# Patient Record
Sex: Male | Born: 1950 | Race: White | Hispanic: No | Marital: Single | Smoking: Former smoker
Health system: Southern US, Community
[De-identification: ages and names within clinical notes are randomized; demographics above are authoritative.]

## PROBLEM LIST (undated history)

## (undated) DIAGNOSIS — J439 Emphysema, unspecified: Secondary | ICD-10-CM

## (undated) DIAGNOSIS — I499 Cardiac arrhythmia, unspecified: Secondary | ICD-10-CM

## (undated) DIAGNOSIS — J45909 Unspecified asthma, uncomplicated: Secondary | ICD-10-CM

## (undated) HISTORY — PX: OTHER SURGICAL HISTORY: SHX169

## (undated) HISTORY — PX: TONSILLECTOMY: SUR1361

## (undated) HISTORY — DX: Unspecified asthma, uncomplicated: J45.909

## (undated) HISTORY — DX: Cardiac arrhythmia, unspecified: I49.9

## (undated) HISTORY — PX: HERNIA REPAIR: SHX51

## (undated) HISTORY — DX: Emphysema, unspecified: J43.9

## (undated) HISTORY — PX: EYE SURGERY: SHX253

## (undated) HISTORY — PX: ADENOIDECTOMY: SUR15

## (undated) HISTORY — PX: SINOSCOPY: SHX187

---

## 1998-11-09 ENCOUNTER — Ambulatory Visit: Admission: RE | Admit: 1998-11-09 | Discharge: 1998-11-09 | Payer: Self-pay

## 2005-06-28 ENCOUNTER — Ambulatory Visit: Payer: Self-pay | Admitting: Internal Medicine

## 2011-12-27 DIAGNOSIS — M75110 Incomplete rotator cuff tear or rupture of unspecified shoulder, not specified as traumatic: Secondary | ICD-10-CM | POA: Insufficient documentation

## 2011-12-27 HISTORY — DX: Incomplete rotator cuff tear or rupture of unspecified shoulder, not specified as traumatic: M75.110

## 2014-09-15 DIAGNOSIS — J310 Chronic rhinitis: Secondary | ICD-10-CM | POA: Diagnosis not present

## 2014-09-15 DIAGNOSIS — R0600 Dyspnea, unspecified: Secondary | ICD-10-CM | POA: Diagnosis not present

## 2014-09-15 DIAGNOSIS — R5383 Other fatigue: Secondary | ICD-10-CM | POA: Diagnosis not present

## 2014-09-15 DIAGNOSIS — G4730 Sleep apnea, unspecified: Secondary | ICD-10-CM | POA: Diagnosis not present

## 2014-09-19 DIAGNOSIS — G4733 Obstructive sleep apnea (adult) (pediatric): Secondary | ICD-10-CM | POA: Diagnosis not present

## 2014-09-22 DIAGNOSIS — R002 Palpitations: Secondary | ICD-10-CM | POA: Diagnosis not present

## 2014-09-22 DIAGNOSIS — J310 Chronic rhinitis: Secondary | ICD-10-CM | POA: Diagnosis not present

## 2014-09-22 DIAGNOSIS — R0602 Shortness of breath: Secondary | ICD-10-CM | POA: Diagnosis not present

## 2014-09-29 DIAGNOSIS — J310 Chronic rhinitis: Secondary | ICD-10-CM | POA: Diagnosis not present

## 2014-10-02 DIAGNOSIS — J449 Chronic obstructive pulmonary disease, unspecified: Secondary | ICD-10-CM | POA: Diagnosis not present

## 2014-10-02 DIAGNOSIS — I472 Ventricular tachycardia: Secondary | ICD-10-CM | POA: Diagnosis not present

## 2014-10-06 DIAGNOSIS — J310 Chronic rhinitis: Secondary | ICD-10-CM | POA: Diagnosis not present

## 2014-10-13 DIAGNOSIS — Z6833 Body mass index (BMI) 33.0-33.9, adult: Secondary | ICD-10-CM | POA: Diagnosis not present

## 2014-10-13 DIAGNOSIS — J310 Chronic rhinitis: Secondary | ICD-10-CM | POA: Diagnosis not present

## 2014-10-13 DIAGNOSIS — M7750 Other enthesopathy of unspecified foot: Secondary | ICD-10-CM | POA: Diagnosis not present

## 2014-10-15 DIAGNOSIS — E785 Hyperlipidemia, unspecified: Secondary | ICD-10-CM | POA: Diagnosis not present

## 2014-10-15 DIAGNOSIS — I472 Ventricular tachycardia: Secondary | ICD-10-CM | POA: Diagnosis not present

## 2014-10-15 DIAGNOSIS — J449 Chronic obstructive pulmonary disease, unspecified: Secondary | ICD-10-CM | POA: Diagnosis not present

## 2014-10-15 DIAGNOSIS — F331 Major depressive disorder, recurrent, moderate: Secondary | ICD-10-CM | POA: Diagnosis not present

## 2014-10-20 DIAGNOSIS — J310 Chronic rhinitis: Secondary | ICD-10-CM | POA: Diagnosis not present

## 2014-10-20 DIAGNOSIS — R002 Palpitations: Secondary | ICD-10-CM | POA: Diagnosis not present

## 2014-10-27 DIAGNOSIS — J310 Chronic rhinitis: Secondary | ICD-10-CM | POA: Diagnosis not present

## 2014-10-30 DIAGNOSIS — I472 Ventricular tachycardia: Secondary | ICD-10-CM | POA: Diagnosis not present

## 2014-11-03 DIAGNOSIS — J310 Chronic rhinitis: Secondary | ICD-10-CM | POA: Diagnosis not present

## 2014-11-03 DIAGNOSIS — M79671 Pain in right foot: Secondary | ICD-10-CM | POA: Diagnosis not present

## 2014-11-03 DIAGNOSIS — Z6833 Body mass index (BMI) 33.0-33.9, adult: Secondary | ICD-10-CM | POA: Diagnosis not present

## 2014-11-10 DIAGNOSIS — J310 Chronic rhinitis: Secondary | ICD-10-CM | POA: Diagnosis not present

## 2014-11-17 DIAGNOSIS — J310 Chronic rhinitis: Secondary | ICD-10-CM | POA: Diagnosis not present

## 2014-11-24 DIAGNOSIS — Z6833 Body mass index (BMI) 33.0-33.9, adult: Secondary | ICD-10-CM | POA: Diagnosis not present

## 2014-11-24 DIAGNOSIS — J310 Chronic rhinitis: Secondary | ICD-10-CM | POA: Diagnosis not present

## 2014-11-24 DIAGNOSIS — M629 Disorder of muscle, unspecified: Secondary | ICD-10-CM | POA: Diagnosis not present

## 2014-11-27 DIAGNOSIS — I493 Ventricular premature depolarization: Secondary | ICD-10-CM | POA: Diagnosis not present

## 2014-12-04 ENCOUNTER — Ambulatory Visit (INDEPENDENT_AMBULATORY_CARE_PROVIDER_SITE_OTHER): Payer: Commercial Managed Care - HMO

## 2014-12-04 VITALS — BP 131/74 | HR 55 | Resp 18

## 2014-12-04 DIAGNOSIS — R52 Pain, unspecified: Secondary | ICD-10-CM

## 2014-12-04 DIAGNOSIS — M722 Plantar fascial fibromatosis: Secondary | ICD-10-CM

## 2014-12-04 DIAGNOSIS — M7731 Calcaneal spur, right foot: Secondary | ICD-10-CM

## 2014-12-04 MED ORDER — MELOXICAM 15 MG PO TABS: 15.0000 mg | ORAL_TABLET | Freq: Every day | ORAL | Status: DC

## 2014-12-04 NOTE — Patient Instructions (Signed)
ICE INSTRUCTIONS  Apply ice or cold pack to the affected area at least 3 times a day for 10-15 minutes each time.  You should also use ice after prolonged activity or vigorous exercise.  Do not apply ice longer than 20 minutes at one time.  Always keep a cloth between your skin and the ice pack to prevent burns.  Being consistent and following these instructions will help control your symptoms.  We suggest you purchase a gel ice pack because they are reusable and do bit leak.  Some of them are designed to wrap around the area.  Use the method that works best for you.  Here are some other suggestions for icing.   Use a frozen bag of peas or corn-inexpensive and molds well to your body, usually stays frozen for 10 to 20 minutes.  Wet a towel with cold water and squeeze out the excess until it's damp.  Place in a bag in the freezer for 20 minutes. Then remove and use.  Apply ice to the heel every evening 15 minutes on 15 minutes off repeat 2 or 3 times.  Maintain the taping on foot for 5 days as instructed

## 2014-12-04 NOTE — Progress Notes (Signed)
   Subjective:    Patient ID: Nicholas Clay, male    DOB: 22-Oct-1950, 64 y.o.   MRN: [id number]  HPI I HAVE SOME ARCH PAIN ON MY RIGHT FOOT AND HAS BEEN GOING ON FOR ABOUT 2 MONTHS AND HAS A SHARP PAIN TO IT AND I HAVE SOME GOOD FEET INSERTS AND THE ARCH ACHES SOME    Review of Systems  HENT: Positive for hearing loss, sinus pressure and sore throat.   Respiratory:       DIFFICULTY BREATHING  All other systems reviewed and are negative.      Objective:   Physical Exam neurovascular status is intact pedal pulses are palpable epicritic and proprioceptive sensations intact and symmetric there is normal plantar response and DTRs. Patient does have a history of heel pain has been wearing good foot orthotics for more than 10 or 15 years. However his right orthotic has extra padding sponge and cushioning in the arch with duct tape holding in place. Patient is having medial arch pain from mid band although to the inferior calcaneal tubercle on the right left foot is asymptomatic. X-rays confirm well developed inferior calcaneal spur fascial thickening promontory changes noted on lateral projection. 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      Assessment & Plan:  Assessment plantar fasciitis/heel spur syndrome right foot plan at this time patient given a prescription for meloxicam 15 g once daily discontinue if any GI upset is noted days does have a old history of possible small bleed however has not had issues since then. At this time fascial strapping is applied to the right  foot reappointed 2 weeks for follow-up and reevaluation and be a strong candidate for functional orthoses a custom orthotic in the future based on progress and understands his Medicare likely not cover orthotic it will be an out-of-pocket costs that he can make payments on. At this time use NSAID ice and fascial strapping recheck in 2 weeks for reevaluation  Harriet Masson DPM

## 2014-12-08 DIAGNOSIS — J310 Chronic rhinitis: Secondary | ICD-10-CM | POA: Diagnosis not present

## 2014-12-09 DIAGNOSIS — I493 Ventricular premature depolarization: Secondary | ICD-10-CM | POA: Diagnosis not present

## 2014-12-11 DIAGNOSIS — I493 Ventricular premature depolarization: Secondary | ICD-10-CM | POA: Diagnosis not present

## 2014-12-11 DIAGNOSIS — J449 Chronic obstructive pulmonary disease, unspecified: Secondary | ICD-10-CM | POA: Diagnosis not present

## 2014-12-22 ENCOUNTER — Ambulatory Visit: Payer: Commercial Managed Care - HMO

## 2014-12-22 DIAGNOSIS — J310 Chronic rhinitis: Secondary | ICD-10-CM | POA: Diagnosis not present

## 2014-12-25 ENCOUNTER — Ambulatory Visit (INDEPENDENT_AMBULATORY_CARE_PROVIDER_SITE_OTHER): Payer: Commercial Managed Care - HMO | Admitting: Podiatry

## 2014-12-25 ENCOUNTER — Encounter: Payer: Self-pay | Admitting: Podiatry

## 2014-12-25 VITALS — BP 118/70 | HR 55 | Resp 14

## 2014-12-25 DIAGNOSIS — M722 Plantar fascial fibromatosis: Secondary | ICD-10-CM | POA: Diagnosis not present

## 2014-12-25 DIAGNOSIS — R52 Pain, unspecified: Secondary | ICD-10-CM

## 2014-12-25 MED ORDER — TRIAMCINOLONE ACETONIDE 10 MG/ML IJ SUSP
10.0000 mg | Freq: Once | INTRAMUSCULAR | Status: AC
  Administered 2014-12-25: 10 mg

## 2014-12-25 NOTE — Patient Instructions (Signed)

## 2014-12-26 NOTE — Progress Notes (Signed)
Subjective:     Patient ID: Nicholas Clay, male   DOB: [birth year], 64 y.o.   MRN: [id number]  HPI patient presents stating that he is getting some pain in his right arch again and it's more distal the taping helped for a short period of time   Review of Systems     Objective:   Physical Exam Neurovascular status intact muscle strength adequate with range of motion subtalar midtarsal joint within normal limits. Distal inflammation of the plantar fascia around its insertion into the head of the first metatarsal and slightly proximal with mild fluid buildup and moderate depression of the arch noted    Assessment:     Distal plantar fasciitis right with arch height abnormal    Plan:     Careful injection distal plantar fascia administered along with fascial brace and instructions on long-term orthotics with scanning administered today. Patient be seen back when orthotics returned

## 2014-12-27 DIAGNOSIS — S0510XA Contusion of eyeball and orbital tissues, unspecified eye, initial encounter: Secondary | ICD-10-CM | POA: Diagnosis not present

## 2014-12-27 DIAGNOSIS — Z6832 Body mass index (BMI) 32.0-32.9, adult: Secondary | ICD-10-CM | POA: Diagnosis not present

## 2014-12-27 DIAGNOSIS — S60229A Contusion of unspecified hand, initial encounter: Secondary | ICD-10-CM | POA: Diagnosis not present

## 2015-01-05 DIAGNOSIS — J310 Chronic rhinitis: Secondary | ICD-10-CM | POA: Diagnosis not present

## 2015-01-06 DIAGNOSIS — Z6832 Body mass index (BMI) 32.0-32.9, adult: Secondary | ICD-10-CM | POA: Diagnosis not present

## 2015-01-06 DIAGNOSIS — M25512 Pain in left shoulder: Secondary | ICD-10-CM | POA: Diagnosis not present

## 2015-01-14 DIAGNOSIS — F331 Major depressive disorder, recurrent, moderate: Secondary | ICD-10-CM | POA: Diagnosis not present

## 2015-01-15 DIAGNOSIS — Z6832 Body mass index (BMI) 32.0-32.9, adult: Secondary | ICD-10-CM | POA: Diagnosis not present

## 2015-01-15 DIAGNOSIS — R21 Rash and other nonspecific skin eruption: Secondary | ICD-10-CM | POA: Diagnosis not present

## 2015-01-19 DIAGNOSIS — M25512 Pain in left shoulder: Secondary | ICD-10-CM | POA: Diagnosis not present

## 2015-01-19 DIAGNOSIS — M25511 Pain in right shoulder: Secondary | ICD-10-CM | POA: Diagnosis not present

## 2015-01-19 DIAGNOSIS — J310 Chronic rhinitis: Secondary | ICD-10-CM | POA: Diagnosis not present

## 2015-01-20 ENCOUNTER — Ambulatory Visit: Payer: Commercial Managed Care - HMO | Admitting: *Deleted

## 2015-01-20 DIAGNOSIS — M722 Plantar fascial fibromatosis: Secondary | ICD-10-CM

## 2015-01-20 NOTE — Progress Notes (Signed)
Patient ID: Nicholas Clay, male   DOB: 10/14/1950, 64 y.o.   MRN: [id number] PICKING UP INSERTS

## 2015-01-20 NOTE — Patient Instructions (Signed)

## 2015-01-27 DIAGNOSIS — M25512 Pain in left shoulder: Secondary | ICD-10-CM | POA: Diagnosis not present

## 2015-01-27 DIAGNOSIS — M25511 Pain in right shoulder: Secondary | ICD-10-CM | POA: Diagnosis not present

## 2015-01-27 DIAGNOSIS — Z1211 Encounter for screening for malignant neoplasm of colon: Secondary | ICD-10-CM | POA: Diagnosis not present

## 2015-01-28 DIAGNOSIS — G4700 Insomnia, unspecified: Secondary | ICD-10-CM | POA: Diagnosis not present

## 2015-01-28 DIAGNOSIS — Z6832 Body mass index (BMI) 32.0-32.9, adult: Secondary | ICD-10-CM | POA: Diagnosis not present

## 2015-01-29 DIAGNOSIS — M25511 Pain in right shoulder: Secondary | ICD-10-CM | POA: Diagnosis not present

## 2015-01-29 DIAGNOSIS — M25512 Pain in left shoulder: Secondary | ICD-10-CM | POA: Diagnosis not present

## 2015-02-02 DIAGNOSIS — J310 Chronic rhinitis: Secondary | ICD-10-CM | POA: Diagnosis not present

## 2015-02-03 DIAGNOSIS — M25512 Pain in left shoulder: Secondary | ICD-10-CM | POA: Diagnosis not present

## 2015-02-03 DIAGNOSIS — M25511 Pain in right shoulder: Secondary | ICD-10-CM | POA: Diagnosis not present

## 2015-02-05 DIAGNOSIS — M25511 Pain in right shoulder: Secondary | ICD-10-CM | POA: Diagnosis not present

## 2015-02-05 DIAGNOSIS — M25512 Pain in left shoulder: Secondary | ICD-10-CM | POA: Diagnosis not present

## 2015-02-12 DIAGNOSIS — M25511 Pain in right shoulder: Secondary | ICD-10-CM | POA: Diagnosis not present

## 2015-02-12 DIAGNOSIS — M25512 Pain in left shoulder: Secondary | ICD-10-CM | POA: Diagnosis not present

## 2015-02-13 DIAGNOSIS — M25512 Pain in left shoulder: Secondary | ICD-10-CM | POA: Diagnosis not present

## 2015-02-13 DIAGNOSIS — M25511 Pain in right shoulder: Secondary | ICD-10-CM | POA: Diagnosis not present

## 2015-02-16 ENCOUNTER — Ambulatory Visit (INDEPENDENT_AMBULATORY_CARE_PROVIDER_SITE_OTHER): Payer: Commercial Managed Care - HMO | Admitting: Podiatry

## 2015-02-16 ENCOUNTER — Encounter: Payer: Self-pay | Admitting: Podiatry

## 2015-02-16 VITALS — BP 113/75 | HR 68 | Resp 18

## 2015-02-16 DIAGNOSIS — R52 Pain, unspecified: Secondary | ICD-10-CM | POA: Diagnosis not present

## 2015-02-16 DIAGNOSIS — J310 Chronic rhinitis: Secondary | ICD-10-CM | POA: Diagnosis not present

## 2015-02-16 DIAGNOSIS — M205X1 Other deformities of toe(s) (acquired), right foot: Secondary | ICD-10-CM | POA: Diagnosis not present

## 2015-02-16 DIAGNOSIS — M722 Plantar fascial fibromatosis: Secondary | ICD-10-CM | POA: Diagnosis not present

## 2015-02-16 NOTE — Progress Notes (Signed)
Subjective:     Patient ID: Nicholas Clay, male   DOB: Jul 01, 1951, 64 y.o.   MRN: [id number]  HPI   This patient presents with continued occasional pain in the bottom of his right foot.  He has been treated with orthoses which he says have helped his balance but has not relived his pain.  He points to the arch of his right foot.  He also relates pain in his big toe joint on occasion.  He presents for evaluation and treatment.  Review of Systems     Objective:   Physical Exam  Dorsalis pedis and posterior tibial pulses palpable.  TG and CR WNL.  Semmes weinstein WNL>  Palpable pain through arch right foot.  He has significant limitation motion right big toe with dorsal prominence as well as HAV deformity.     Assessment:     Plantar Fascitis right foot. 2. 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     Plan:     To add kinectic wedge and plantar fascial groove right orthotic.  Told patient his big toe joint is contributing to his foot pain.  To call patient when orthoses return from lab.

## 2015-02-16 NOTE — Progress Notes (Signed)
   Subjective:    Patient ID: Nicholas Clay, male    DOB: [birth year], 64 y.o.   MRN: [id number]  HPI my inserts are not helping me, the left insert needs more padding and the right is the worst hurts in the arch and needs a pad put in    Review of Systems     Objective:   Physical Exam        Assessment & Plan:

## 2015-02-17 DIAGNOSIS — M25511 Pain in right shoulder: Secondary | ICD-10-CM | POA: Diagnosis not present

## 2015-02-17 DIAGNOSIS — M25512 Pain in left shoulder: Secondary | ICD-10-CM | POA: Diagnosis not present

## 2015-02-19 DIAGNOSIS — M25512 Pain in left shoulder: Secondary | ICD-10-CM | POA: Diagnosis not present

## 2015-02-19 DIAGNOSIS — M25511 Pain in right shoulder: Secondary | ICD-10-CM | POA: Diagnosis not present

## 2015-02-24 DIAGNOSIS — M25511 Pain in right shoulder: Secondary | ICD-10-CM | POA: Diagnosis not present

## 2015-02-24 DIAGNOSIS — M25512 Pain in left shoulder: Secondary | ICD-10-CM | POA: Diagnosis not present

## 2015-02-26 ENCOUNTER — Telehealth: Payer: Self-pay | Admitting: *Deleted

## 2015-02-26 NOTE — Telephone Encounter (Signed)
Patient called asking status of inserts.  Please call

## 2015-03-02 DIAGNOSIS — J310 Chronic rhinitis: Secondary | ICD-10-CM | POA: Diagnosis not present

## 2015-03-03 DIAGNOSIS — M25511 Pain in right shoulder: Secondary | ICD-10-CM | POA: Diagnosis not present

## 2015-03-03 DIAGNOSIS — M25512 Pain in left shoulder: Secondary | ICD-10-CM | POA: Diagnosis not present

## 2015-03-03 NOTE — Telephone Encounter (Signed)
Had Albina Billet to call patient and left a message for him. Lattie Haw

## 2015-03-09 ENCOUNTER — Encounter: Payer: Self-pay | Admitting: Podiatry

## 2015-03-09 ENCOUNTER — Ambulatory Visit (INDEPENDENT_AMBULATORY_CARE_PROVIDER_SITE_OTHER): Payer: Commercial Managed Care - HMO | Admitting: Podiatry

## 2015-03-09 VITALS — BP 126/83 | HR 59 | Resp 18

## 2015-03-09 DIAGNOSIS — M722 Plantar fascial fibromatosis: Secondary | ICD-10-CM | POA: Diagnosis not present

## 2015-03-09 DIAGNOSIS — M205X1 Other deformities of toe(s) (acquired), right foot: Secondary | ICD-10-CM

## 2015-03-09 NOTE — Progress Notes (Signed)
Subjective:     Patient ID: Nicholas Clay, male   DOB: 16-Oct-1950, 64 y.o.   MRN: [id number]  HPIPatient is here to pick up his redone orthoses.   Review of Systems     Objective:   Physical Exam Objective: Review of past medical history, medications, social history and allergies were performed.  Vascular: Dorsalis pedis and posterior tibial pulses were palpable B/L, capillary refill was  WNL B/L, temperature gradient was WNL B/L   Skin:  No signs of symptoms of infection or ulcers on both feet  Nails: appear healthy with no signs of mycosis or infections  Sensory: Semmes Weinstein monifilament WNL   Orthopedic: Orthopedic evaluation demonstrates all joints distal t ankle have full ROM without crepitus, muscle power WNL B/L.  Palpable pain right arch.  Significant ROM 1st MPJ right foot     Assessment:     Plantar Fasciitis  2.  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     Plan:     ROV

## 2015-03-10 DIAGNOSIS — M25511 Pain in right shoulder: Secondary | ICD-10-CM | POA: Diagnosis not present

## 2015-03-10 DIAGNOSIS — M25512 Pain in left shoulder: Secondary | ICD-10-CM | POA: Diagnosis not present

## 2015-03-19 DIAGNOSIS — Z6832 Body mass index (BMI) 32.0-32.9, adult: Secondary | ICD-10-CM | POA: Diagnosis not present

## 2015-03-19 DIAGNOSIS — M791 Myalgia: Secondary | ICD-10-CM | POA: Diagnosis not present

## 2015-03-23 DIAGNOSIS — J310 Chronic rhinitis: Secondary | ICD-10-CM | POA: Diagnosis not present

## 2015-03-31 ENCOUNTER — Telehealth: Payer: Self-pay | Admitting: *Deleted

## 2015-03-31 NOTE — Telephone Encounter (Signed)
Patient came and dropped his orthotics off on 03/18/15  that Dr Prudence Davidson had redone by [facility] and the patient states that they do not work and the metatarsal pads were taken out and that they are not fitting good and just wants the charge taken off bill and I called patient back when I was back in the office and stated that I would check with billing department and I would call him back to let him now what they said and I stated to the patient that it was not likely we could do that because they are your inserts and we could have patient come back in and have the doctor look at them again and adjust as needed and patient stated that he was not going to come back in and to take the charge off of his bill. Lattie Haw

## 2015-04-02 DIAGNOSIS — M79671 Pain in right foot: Secondary | ICD-10-CM | POA: Diagnosis not present

## 2015-04-02 DIAGNOSIS — Z6832 Body mass index (BMI) 32.0-32.9, adult: Secondary | ICD-10-CM | POA: Diagnosis not present

## 2015-04-02 DIAGNOSIS — R1310 Dysphagia, unspecified: Secondary | ICD-10-CM | POA: Diagnosis not present

## 2015-04-08 ENCOUNTER — Telehealth: Payer: Self-pay | Admitting: *Deleted

## 2015-04-08 NOTE — Telephone Encounter (Signed)
Patient left voicemail 04/06/15 10:26am while I was on vacation.  Patient call left his name date of birth and phone number with no additional information.

## 2015-04-09 DIAGNOSIS — M25579 Pain in unspecified ankle and joints of unspecified foot: Secondary | ICD-10-CM | POA: Diagnosis not present

## 2015-04-09 DIAGNOSIS — M2011 Hallux valgus (acquired), right foot: Secondary | ICD-10-CM | POA: Diagnosis not present

## 2015-04-13 DIAGNOSIS — J310 Chronic rhinitis: Secondary | ICD-10-CM | POA: Diagnosis not present

## 2015-04-15 DIAGNOSIS — R1310 Dysphagia, unspecified: Secondary | ICD-10-CM | POA: Diagnosis not present

## 2015-04-20 DIAGNOSIS — K222 Esophageal obstruction: Secondary | ICD-10-CM | POA: Diagnosis not present

## 2015-04-20 DIAGNOSIS — R1310 Dysphagia, unspecified: Secondary | ICD-10-CM | POA: Diagnosis not present

## 2015-04-20 DIAGNOSIS — K449 Diaphragmatic hernia without obstruction or gangrene: Secondary | ICD-10-CM | POA: Diagnosis not present

## 2015-04-27 DIAGNOSIS — J310 Chronic rhinitis: Secondary | ICD-10-CM | POA: Diagnosis not present

## 2015-04-28 DIAGNOSIS — Z79899 Other long term (current) drug therapy: Secondary | ICD-10-CM | POA: Diagnosis not present

## 2015-04-28 DIAGNOSIS — Z6832 Body mass index (BMI) 32.0-32.9, adult: Secondary | ICD-10-CM | POA: Diagnosis not present

## 2015-04-28 DIAGNOSIS — E785 Hyperlipidemia, unspecified: Secondary | ICD-10-CM | POA: Diagnosis not present

## 2015-04-28 DIAGNOSIS — J45909 Unspecified asthma, uncomplicated: Secondary | ICD-10-CM | POA: Diagnosis not present

## 2015-04-28 DIAGNOSIS — G4700 Insomnia, unspecified: Secondary | ICD-10-CM | POA: Diagnosis not present

## 2015-04-28 DIAGNOSIS — F329 Major depressive disorder, single episode, unspecified: Secondary | ICD-10-CM | POA: Diagnosis not present

## 2015-04-29 DIAGNOSIS — F331 Major depressive disorder, recurrent, moderate: Secondary | ICD-10-CM | POA: Diagnosis not present

## 2015-04-30 DIAGNOSIS — M2011 Hallux valgus (acquired), right foot: Secondary | ICD-10-CM | POA: Diagnosis not present

## 2015-04-30 DIAGNOSIS — M722 Plantar fascial fibromatosis: Secondary | ICD-10-CM | POA: Diagnosis not present

## 2015-05-11 DIAGNOSIS — J310 Chronic rhinitis: Secondary | ICD-10-CM | POA: Diagnosis not present

## 2015-05-25 DIAGNOSIS — Z1211 Encounter for screening for malignant neoplasm of colon: Secondary | ICD-10-CM | POA: Diagnosis not present

## 2015-05-25 DIAGNOSIS — K5730 Diverticulosis of large intestine without perforation or abscess without bleeding: Secondary | ICD-10-CM | POA: Diagnosis not present

## 2015-05-26 DIAGNOSIS — M2011 Hallux valgus (acquired), right foot: Secondary | ICD-10-CM | POA: Diagnosis not present

## 2015-05-26 DIAGNOSIS — M722 Plantar fascial fibromatosis: Secondary | ICD-10-CM | POA: Diagnosis not present

## 2015-06-02 DIAGNOSIS — J310 Chronic rhinitis: Secondary | ICD-10-CM | POA: Diagnosis not present

## 2015-06-22 DIAGNOSIS — J310 Chronic rhinitis: Secondary | ICD-10-CM | POA: Diagnosis not present

## 2015-07-06 DIAGNOSIS — J310 Chronic rhinitis: Secondary | ICD-10-CM | POA: Diagnosis not present

## 2015-07-20 DIAGNOSIS — J310 Chronic rhinitis: Secondary | ICD-10-CM | POA: Diagnosis not present

## 2015-07-22 DIAGNOSIS — G4730 Sleep apnea, unspecified: Secondary | ICD-10-CM | POA: Diagnosis not present

## 2015-07-22 DIAGNOSIS — Z6831 Body mass index (BMI) 31.0-31.9, adult: Secondary | ICD-10-CM | POA: Diagnosis not present

## 2015-07-22 DIAGNOSIS — G479 Sleep disorder, unspecified: Secondary | ICD-10-CM | POA: Diagnosis not present

## 2015-08-03 DIAGNOSIS — J301 Allergic rhinitis due to pollen: Secondary | ICD-10-CM | POA: Diagnosis not present

## 2015-08-19 DIAGNOSIS — R5383 Other fatigue: Secondary | ICD-10-CM | POA: Diagnosis not present

## 2015-08-19 DIAGNOSIS — J310 Chronic rhinitis: Secondary | ICD-10-CM | POA: Diagnosis not present

## 2015-08-19 DIAGNOSIS — G4733 Obstructive sleep apnea (adult) (pediatric): Secondary | ICD-10-CM | POA: Diagnosis not present

## 2015-08-19 DIAGNOSIS — J449 Chronic obstructive pulmonary disease, unspecified: Secondary | ICD-10-CM | POA: Diagnosis not present

## 2015-08-27 DIAGNOSIS — E785 Hyperlipidemia, unspecified: Secondary | ICD-10-CM | POA: Diagnosis not present

## 2015-08-27 DIAGNOSIS — G4700 Insomnia, unspecified: Secondary | ICD-10-CM | POA: Diagnosis not present

## 2015-08-27 DIAGNOSIS — F329 Major depressive disorder, single episode, unspecified: Secondary | ICD-10-CM | POA: Diagnosis not present

## 2015-08-27 DIAGNOSIS — Z6831 Body mass index (BMI) 31.0-31.9, adult: Secondary | ICD-10-CM | POA: Diagnosis not present

## 2015-08-27 DIAGNOSIS — J45909 Unspecified asthma, uncomplicated: Secondary | ICD-10-CM | POA: Diagnosis not present

## 2015-08-31 DIAGNOSIS — J310 Chronic rhinitis: Secondary | ICD-10-CM | POA: Diagnosis not present

## 2015-09-11 DIAGNOSIS — F331 Major depressive disorder, recurrent, moderate: Secondary | ICD-10-CM | POA: Diagnosis not present

## 2015-09-22 DIAGNOSIS — J310 Chronic rhinitis: Secondary | ICD-10-CM | POA: Diagnosis not present

## 2015-10-01 DIAGNOSIS — G4733 Obstructive sleep apnea (adult) (pediatric): Secondary | ICD-10-CM | POA: Diagnosis not present

## 2015-10-05 DIAGNOSIS — J310 Chronic rhinitis: Secondary | ICD-10-CM | POA: Diagnosis not present

## 2015-10-07 DIAGNOSIS — R42 Dizziness and giddiness: Secondary | ICD-10-CM | POA: Diagnosis not present

## 2015-10-07 DIAGNOSIS — Z6832 Body mass index (BMI) 32.0-32.9, adult: Secondary | ICD-10-CM | POA: Diagnosis not present

## 2015-10-07 DIAGNOSIS — J329 Chronic sinusitis, unspecified: Secondary | ICD-10-CM | POA: Diagnosis not present

## 2015-10-16 DIAGNOSIS — Z6832 Body mass index (BMI) 32.0-32.9, adult: Secondary | ICD-10-CM | POA: Diagnosis not present

## 2015-10-16 DIAGNOSIS — J45901 Unspecified asthma with (acute) exacerbation: Secondary | ICD-10-CM | POA: Diagnosis not present

## 2015-10-26 DIAGNOSIS — J301 Allergic rhinitis due to pollen: Secondary | ICD-10-CM | POA: Diagnosis not present

## 2015-11-06 DIAGNOSIS — N410 Acute prostatitis: Secondary | ICD-10-CM | POA: Diagnosis not present

## 2015-11-06 DIAGNOSIS — R351 Nocturia: Secondary | ICD-10-CM | POA: Diagnosis not present

## 2015-11-06 DIAGNOSIS — N401 Enlarged prostate with lower urinary tract symptoms: Secondary | ICD-10-CM | POA: Diagnosis not present

## 2015-11-06 DIAGNOSIS — R972 Elevated prostate specific antigen [PSA]: Secondary | ICD-10-CM | POA: Diagnosis not present

## 2015-11-06 DIAGNOSIS — N3020 Other chronic cystitis without hematuria: Secondary | ICD-10-CM | POA: Diagnosis not present

## 2015-11-16 DIAGNOSIS — H9319 Tinnitus, unspecified ear: Secondary | ICD-10-CM | POA: Diagnosis not present

## 2015-11-16 DIAGNOSIS — J3489 Other specified disorders of nose and nasal sinuses: Secondary | ICD-10-CM | POA: Diagnosis not present

## 2015-11-16 DIAGNOSIS — J310 Chronic rhinitis: Secondary | ICD-10-CM | POA: Diagnosis not present

## 2015-11-16 DIAGNOSIS — J339 Nasal polyp, unspecified: Secondary | ICD-10-CM | POA: Diagnosis not present

## 2015-11-16 DIAGNOSIS — J329 Chronic sinusitis, unspecified: Secondary | ICD-10-CM | POA: Diagnosis not present

## 2015-11-16 DIAGNOSIS — R42 Dizziness and giddiness: Secondary | ICD-10-CM | POA: Diagnosis not present

## 2015-11-23 DIAGNOSIS — R319 Hematuria, unspecified: Secondary | ICD-10-CM | POA: Diagnosis not present

## 2015-11-23 DIAGNOSIS — Z6833 Body mass index (BMI) 33.0-33.9, adult: Secondary | ICD-10-CM | POA: Diagnosis not present

## 2015-11-24 DIAGNOSIS — N3020 Other chronic cystitis without hematuria: Secondary | ICD-10-CM | POA: Diagnosis not present

## 2015-11-24 DIAGNOSIS — N401 Enlarged prostate with lower urinary tract symptoms: Secondary | ICD-10-CM | POA: Diagnosis not present

## 2015-11-24 DIAGNOSIS — N410 Acute prostatitis: Secondary | ICD-10-CM | POA: Diagnosis not present

## 2015-11-24 DIAGNOSIS — R972 Elevated prostate specific antigen [PSA]: Secondary | ICD-10-CM | POA: Diagnosis not present

## 2015-11-24 DIAGNOSIS — N538 Other male sexual dysfunction: Secondary | ICD-10-CM | POA: Diagnosis not present

## 2015-11-25 DIAGNOSIS — J329 Chronic sinusitis, unspecified: Secondary | ICD-10-CM | POA: Diagnosis not present

## 2015-11-25 DIAGNOSIS — R930 Abnormal findings on diagnostic imaging of skull and head, not elsewhere classified: Secondary | ICD-10-CM | POA: Diagnosis not present

## 2015-12-04 DIAGNOSIS — F331 Major depressive disorder, recurrent, moderate: Secondary | ICD-10-CM | POA: Diagnosis not present

## 2015-12-07 DIAGNOSIS — J310 Chronic rhinitis: Secondary | ICD-10-CM | POA: Diagnosis not present

## 2015-12-07 DIAGNOSIS — R5383 Other fatigue: Secondary | ICD-10-CM | POA: Diagnosis not present

## 2015-12-07 DIAGNOSIS — Z6832 Body mass index (BMI) 32.0-32.9, adult: Secondary | ICD-10-CM | POA: Diagnosis not present

## 2015-12-07 DIAGNOSIS — G4709 Other insomnia: Secondary | ICD-10-CM | POA: Diagnosis not present

## 2015-12-08 DIAGNOSIS — R351 Nocturia: Secondary | ICD-10-CM | POA: Diagnosis not present

## 2015-12-08 DIAGNOSIS — N410 Acute prostatitis: Secondary | ICD-10-CM | POA: Diagnosis not present

## 2015-12-08 DIAGNOSIS — N528 Other male erectile dysfunction: Secondary | ICD-10-CM | POA: Diagnosis not present

## 2015-12-08 DIAGNOSIS — N318 Other neuromuscular dysfunction of bladder: Secondary | ICD-10-CM | POA: Diagnosis not present

## 2015-12-08 DIAGNOSIS — R972 Elevated prostate specific antigen [PSA]: Secondary | ICD-10-CM | POA: Diagnosis not present

## 2015-12-08 DIAGNOSIS — N3020 Other chronic cystitis without hematuria: Secondary | ICD-10-CM | POA: Diagnosis not present

## 2015-12-08 DIAGNOSIS — N401 Enlarged prostate with lower urinary tract symptoms: Secondary | ICD-10-CM | POA: Diagnosis not present

## 2015-12-11 DIAGNOSIS — J343 Hypertrophy of nasal turbinates: Secondary | ICD-10-CM | POA: Diagnosis not present

## 2015-12-11 DIAGNOSIS — J323 Chronic sphenoidal sinusitis: Secondary | ICD-10-CM | POA: Diagnosis not present

## 2015-12-11 DIAGNOSIS — J321 Chronic frontal sinusitis: Secondary | ICD-10-CM | POA: Diagnosis not present

## 2015-12-11 DIAGNOSIS — J320 Chronic maxillary sinusitis: Secondary | ICD-10-CM | POA: Diagnosis not present

## 2015-12-11 DIAGNOSIS — J322 Chronic ethmoidal sinusitis: Secondary | ICD-10-CM | POA: Diagnosis not present

## 2015-12-11 DIAGNOSIS — J339 Nasal polyp, unspecified: Secondary | ICD-10-CM | POA: Diagnosis not present

## 2015-12-24 DIAGNOSIS — Z01818 Encounter for other preprocedural examination: Secondary | ICD-10-CM | POA: Diagnosis not present

## 2015-12-28 DIAGNOSIS — J310 Chronic rhinitis: Secondary | ICD-10-CM | POA: Diagnosis not present

## 2015-12-29 DIAGNOSIS — E785 Hyperlipidemia, unspecified: Secondary | ICD-10-CM | POA: Diagnosis not present

## 2015-12-29 DIAGNOSIS — Z79899 Other long term (current) drug therapy: Secondary | ICD-10-CM | POA: Diagnosis not present

## 2016-01-14 DIAGNOSIS — H903 Sensorineural hearing loss, bilateral: Secondary | ICD-10-CM | POA: Diagnosis not present

## 2016-01-14 DIAGNOSIS — H9072 Mixed conductive and sensorineural hearing loss, unilateral, left ear, with unrestricted hearing on the contralateral side: Secondary | ICD-10-CM | POA: Diagnosis not present

## 2016-01-14 DIAGNOSIS — H9311 Tinnitus, right ear: Secondary | ICD-10-CM | POA: Diagnosis not present

## 2016-01-14 DIAGNOSIS — H905 Unspecified sensorineural hearing loss: Secondary | ICD-10-CM | POA: Diagnosis not present

## 2016-01-14 DIAGNOSIS — H9319 Tinnitus, unspecified ear: Secondary | ICD-10-CM | POA: Diagnosis not present

## 2016-01-14 DIAGNOSIS — R42 Dizziness and giddiness: Secondary | ICD-10-CM | POA: Diagnosis not present

## 2016-01-20 DIAGNOSIS — J310 Chronic rhinitis: Secondary | ICD-10-CM | POA: Diagnosis not present

## 2016-01-29 DIAGNOSIS — F331 Major depressive disorder, recurrent, moderate: Secondary | ICD-10-CM | POA: Diagnosis not present

## 2016-02-10 DIAGNOSIS — J310 Chronic rhinitis: Secondary | ICD-10-CM | POA: Diagnosis not present

## 2016-02-17 DIAGNOSIS — R748 Abnormal levels of other serum enzymes: Secondary | ICD-10-CM | POA: Diagnosis not present

## 2016-02-17 DIAGNOSIS — R7989 Other specified abnormal findings of blood chemistry: Secondary | ICD-10-CM | POA: Diagnosis not present

## 2016-02-17 DIAGNOSIS — Z6831 Body mass index (BMI) 31.0-31.9, adult: Secondary | ICD-10-CM | POA: Diagnosis not present

## 2016-03-01 DIAGNOSIS — J310 Chronic rhinitis: Secondary | ICD-10-CM | POA: Diagnosis not present

## 2016-03-21 DIAGNOSIS — J310 Chronic rhinitis: Secondary | ICD-10-CM | POA: Diagnosis not present

## 2016-03-30 ENCOUNTER — Encounter: Payer: Self-pay | Admitting: Allergy and Immunology

## 2016-03-30 ENCOUNTER — Ambulatory Visit (INDEPENDENT_AMBULATORY_CARE_PROVIDER_SITE_OTHER): Payer: Commercial Managed Care - HMO | Admitting: Allergy and Immunology

## 2016-03-30 VITALS — BP 128/78 | HR 64 | Temp 98.1°F | Resp 16 | Ht 70.08 in | Wt 230.8 lb

## 2016-03-30 DIAGNOSIS — J449 Chronic obstructive pulmonary disease, unspecified: Secondary | ICD-10-CM | POA: Diagnosis not present

## 2016-03-30 DIAGNOSIS — J45909 Unspecified asthma, uncomplicated: Secondary | ICD-10-CM

## 2016-03-30 DIAGNOSIS — J309 Allergic rhinitis, unspecified: Secondary | ICD-10-CM

## 2016-03-30 DIAGNOSIS — H1010 Acute atopic conjunctivitis, unspecified eye: Secondary | ICD-10-CM | POA: Diagnosis not present

## 2016-03-30 DIAGNOSIS — J387 Other diseases of larynx: Secondary | ICD-10-CM

## 2016-03-30 DIAGNOSIS — K219 Gastro-esophageal reflux disease without esophagitis: Secondary | ICD-10-CM

## 2016-03-30 MED ORDER — RANITIDINE HCL 300 MG PO CAPS: 300.0000 mg | ORAL_CAPSULE | Freq: Every evening | ORAL | Status: DC

## 2016-03-30 MED ORDER — FLUTICASONE FUROATE-VILANTEROL 200-25 MCG/INH IN AEPB
1.0000 | INHALATION_SPRAY | Freq: Every day | RESPIRATORY_TRACT | Status: DC
Start: 2016-03-30 — End: 2016-09-30

## 2016-03-30 MED ORDER — OMEPRAZOLE 40 MG PO CPDR: 40.0000 mg | DELAYED_RELEASE_CAPSULE | Freq: Every day | ORAL | Status: DC

## 2016-03-30 NOTE — Patient Instructions (Addendum)
  1. Allergen avoidance measures  2. Treat and prevent inflammation:   A. OTC Rhinocort one spray each nostril one time per day. Coupon. Sample.  B. Breo 200 one inhalation 1 time per day. Sample. Coupon.  3. Treat and prevent reflux:   A. remain away from caffeine and chocolate  B. increase omeprazole to 40 mg 1 time per day  C. start ranitidine 300 mg one tablet once a day  4. If needed:   A. Combivent 2 inhalations every 4-6 hours  B. cetirizine 10 mg one tablet once a day  C. nasal saline wash multiple times a day  5. Review esophageal biopsies from Dr. Carmie End upper endoscopy 9 months ago  6. Review last chest x-ray report  7. Immunotherapy?  8. Return to clinic in 4 weeks or earlier if problem

## 2016-03-30 NOTE — Progress Notes (Signed)
Dear Dr. Henrene Pastor,  Thank you for referring Nicholas Clay. to the [facility] on 03/30/2016.   Below is a summation of this patient's evaluation and recommendations.  Thank you for your referral. I will keep you informed about this patient's response to treatment.   If you have any questions please to do hesitate to contact me.   Sincerely,  Jiles Prows, MD [facility]   ______________________________________________________________________    NEW PATIENT NOTE  Referring Provider: Lillard Anes,* Primary Provider: Lillard Anes, MD Date of office visit: 03/30/2016    Subjective:   Chief Complaint:  Orlene Plum. (DOB: 1951/07/20) is a 65 y.o. male with a chief complaint of Allergies; Asthma; and Sleep Apnea  who presents to the clinic on 03/30/2016 with the following problems:  HPI: Shahrukh presents to this clinic in evaluation of allergies and other respiratory tract problems.  He has a long history of problems with his upper respiratory tract manifested as recurrent episodes of sneezing and sniffing and itchy red watery eyes occurring on a perennial basis with flare up during the spring and fall season following exposure to the outdoors and following exposure to dust. He apparently has been receiving immunotherapy from Dr. Alcide Clever over the course of the past 2-3 years which has helped him somewhat. He was given a nasal steroid in the past but he does not use this on a consistent basis. He has persistent decreased ability to smell and some intermittent anosmia. He apparently underwent sinus surgery by Dr. Gaylyn Cheers 2 years ago which gave him "more airflow" to his airway.  He also has a history of intermittent episodes of wheezing and coughing and congestion in his chest. He cannot exert himself at all without getting short of breath. If he ever trys to exercise such as slow jog  he cannot perform this activity. Cold air precipitates this issue as does the development of a head cold and exposure to dust. He uses Combivent from a daily basis to once a week and occasionally uses nebulized albuterol. It sounds as though he did receive systemic steroids to treat this condition in the spring of 2016.  He has had problems swallowing any solids for which he visied Dr. Melina Copa who performed an upper endoscopy about 9 months ago and dilated his esophagus. He does not know the results of his esophageal biopsy. At that point in time he was placed on Prilosec. He does have constant throat clearing and postnasal drip that he cannot clear out of his throat. The stuff that he does see in his throat is clear to yellow. He does not drink any caffeine or eat any chocolate or drink any alcohol.  He smoked for approximately 43 years which he discontinued 4 years ago.  Past Medical History  Diagnosis Date  . Irregular heart beat   . Asthma   . Emphysema of lung [facility])     Past Surgical History  Procedure Laterality Date  . Hernia repair    . Eye surgery    . Tonsil surgery    . Plastic surgery    . Adenoidectomy    . Tonsillectomy    . Sinoscopy    . Left ear deafness  1954      Medication List           ADVAIR HFA IN  Inhale into the lungs.  albuterol (2.5 MG/3ML) 0.083% nebulizer solution  Commonly known as:  PROVENTIL  Take 2.5 mg by nebulization every 4 (four) hours as needed for wheezing or shortness of breath.     albuterol-ipratropium 18-103 MCG/ACT inhaler  Commonly known as:  COMBIVENT  Inhale into the lungs every 4 (four) hours.     atorvastatin 10 MG tablet  Commonly known as:  LIPITOR  Take 10 mg by mouth daily.     Cetirizine HCl 10 MG Caps  Take by mouth.     finasteride 5 MG tablet  Commonly known as:  PROSCAR  Take 5 mg by mouth daily.     FLUoxetine 20 MG capsule  Commonly known as:  PROZAC  Take 20 mg by mouth daily.     omeprazole 20 MG  capsule  Commonly known as:  PRILOSEC  Take 20 mg by mouth daily.     tamsulosin 0.4 MG Caps capsule  Commonly known as:  FLOMAX  Take 0.4 mg by mouth.        No Known Allergies  Review of systems negative except as noted in HPI / PMHx or noted below:  Review of Systems  Constitutional: Negative.   HENT: Negative.   Eyes: Negative.   Respiratory: Negative.   Cardiovascular: Negative.   Gastrointestinal: Negative.   Genitourinary: Negative.   Musculoskeletal: Negative.   Skin: Negative.   Neurological: Negative.   Endo/Heme/Allergies: Negative.   Psychiatric/Behavioral: Negative.     Family History  Problem Relation Age of Onset  . Heart attack Mother   . Heart attack Father   . Heart disease Maternal Aunt   . Osteoporosis Maternal Aunt   . Emphysema Maternal Aunt     Social History   Social History  . Marital Status: Single    Spouse Name: N/A  . Number of Children: N/A  . Years of Education: N/A   Occupational History  . Not on file.   Social History Main Topics  . Smoking status: Former Smoker -- 1.00 packs/day for 43 years    Types: Cigarettes    Quit date: 06/22/2011  . Smokeless tobacco: Never Used  . Alcohol Use: No  . Drug Use: No  . Sexual Activity: Not on file   Other Topics Concern  . Not on file   Social History Narrative    Environmental and Social history  Lives in a house with a dry environment, cats and dogs located inside the household, carpeting in the bedroom, no plastic on the bed or pillow, and no smoking ongoing with inside the household   Objective:   Filed Vitals:   03/30/16 1350  BP: 128/78  Pulse: 64  Temp: 98.1 F (36.7 C)  Resp: 16   Height: 5' 10.08" (178 cm) Weight: 230 lb 12.8 oz (104.69 kg)  Physical Exam  Constitutional: He is well-developed, well-nourished, and in no distress.  Nasal voice, throat clearing, slight cough  HENT:  Head: Normocephalic. Head is without right periorbital erythema and  without left periorbital erythema.  Right Ear: Tympanic membrane, external ear and ear canal normal.  Left Ear: Tympanic membrane, external ear and ear canal normal.  Nose: Mucosal edema present. No rhinorrhea.  Mouth/Throat: Oropharynx is clear and moist and mucous membranes are normal. No oropharyngeal exudate.  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 Pupils are equal, round, and reactive to light.  Neck: Trachea normal. No tracheal deviation present. No thyromegaly present.  Cardiovascular: Normal  rate, regular rhythm, S1 normal, S2 normal and normal heart sounds.   No murmur heard. Pulmonary/Chest: Effort normal. No stridor. No tachypnea. No respiratory distress. He has no wheezes. He has no rales. He exhibits no tenderness.  Abdominal: Soft. He exhibits no distension and no mass. There is no hepatosplenomegaly. There is no tenderness. There is no rebound and no guarding.  Musculoskeletal: He exhibits no edema or tenderness.  Lymphadenopathy:       Head (right side): No tonsillar adenopathy present.       Head (left side): No tonsillar adenopathy present.    He has no cervical adenopathy.    He has no axillary adenopathy.  Neurological: He is alert. Gait normal.  Skin: No rash noted. He is not diaphoretic. No erythema. No pallor. Nails show no clubbing.  Psychiatric: Mood and affect normal.     Diagnostics: Allergy skin tests were performed. He demonstrated hypersensitivity against grasses, weeds, trees, molds, cat, dog, and house dust mite  Spirometry was performed and demonstrated an FEV1 of 1.95 @ 55 % of predicted. Following the administration of nebulized albuterol his FEV1 increased to 2.66 which was an increase in the FEV1 of 36%  The patient had an Asthma Control Test with the following results: ACT Total Score: 20.    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 Allergic rhinoconjunctivitis   3. LPRD (laryngopharyngeal reflux disease)     1. Allergen avoidance measures  2. Treat and prevent inflammation:   A. OTC Rhinocort one spray each nostril one time per day. Coupon. Sample.  B. Breo 200 one inhalation 1 time per day. Sample. Coupon.  3. Treat and prevent reflux:   A. remain away from caffeine and chocolate  B. increase omeprazole to 40 mg 1 time per day  C. start ranitidine 300 mg one tablet once a day  4. If needed:   A. Combivent 2 inhalations every 4-6 hours  B. cetirizine 10 mg one tablet once a day  C. nasal saline wash multiple times a day  5. Immunotherapy?  6. Return to clinic in 4 weeks or earlier if problem  Zaion obviously has significant inflammation of both his upper and lower respiratory tract. I suspect that the triggers giving rise to this inflammation is an atopic trigger and possibly a reflux trigger. He very well may have rolled over into a component of chronic sinusitis once again. We'll see how he does with anti-inflammatory medications and attention to allergen avoidance measures and treatment with reflux over the course of the next 4 weeks and then make a decision about how to proceed pending his response. I'll regroup with him in 4 weeks or earlier if there is a problem.  Jiles Prows, MD [facility]

## 2016-04-20 DIAGNOSIS — J4489 Other specified chronic obstructive pulmonary disease: Secondary | ICD-10-CM | POA: Insufficient documentation

## 2016-04-20 DIAGNOSIS — J45909 Unspecified asthma, uncomplicated: Secondary | ICD-10-CM | POA: Diagnosis not present

## 2016-04-20 DIAGNOSIS — Z87891 Personal history of nicotine dependence: Secondary | ICD-10-CM

## 2016-04-20 DIAGNOSIS — G4733 Obstructive sleep apnea (adult) (pediatric): Secondary | ICD-10-CM

## 2016-04-20 DIAGNOSIS — J449 Chronic obstructive pulmonary disease, unspecified: Secondary | ICD-10-CM

## 2016-04-20 HISTORY — DX: Personal history of nicotine dependence: Z87.891

## 2016-04-20 HISTORY — DX: Obstructive sleep apnea (adult) (pediatric): G47.33

## 2016-04-20 HISTORY — DX: Chronic obstructive pulmonary disease, unspecified: J44.9

## 2016-04-27 ENCOUNTER — Ambulatory Visit (INDEPENDENT_AMBULATORY_CARE_PROVIDER_SITE_OTHER): Payer: Commercial Managed Care - HMO | Admitting: Allergy and Immunology

## 2016-04-27 ENCOUNTER — Encounter: Payer: Self-pay | Admitting: Allergy and Immunology

## 2016-04-27 VITALS — BP 120/72 | HR 64 | Resp 16

## 2016-04-27 DIAGNOSIS — H1045 Other chronic allergic conjunctivitis: Secondary | ICD-10-CM

## 2016-04-27 DIAGNOSIS — J449 Chronic obstructive pulmonary disease, unspecified: Secondary | ICD-10-CM | POA: Diagnosis not present

## 2016-04-27 DIAGNOSIS — J387 Other diseases of larynx: Secondary | ICD-10-CM

## 2016-04-27 DIAGNOSIS — J45909 Unspecified asthma, uncomplicated: Secondary | ICD-10-CM

## 2016-04-27 DIAGNOSIS — J309 Allergic rhinitis, unspecified: Secondary | ICD-10-CM

## 2016-04-27 DIAGNOSIS — J302 Other seasonal allergic rhinitis: Secondary | ICD-10-CM

## 2016-04-27 DIAGNOSIS — K219 Gastro-esophageal reflux disease without esophagitis: Secondary | ICD-10-CM

## 2016-04-27 DIAGNOSIS — H1010 Acute atopic conjunctivitis, unspecified eye: Secondary | ICD-10-CM

## 2016-04-27 NOTE — Patient Instructions (Addendum)
  1. Continue to perform Allergen avoidance measures  2. Continue to Treat and prevent inflammation:   A. Flonase one spray each nostril twice a day  B. Breo 200 one inhalation 1 time per day. Sample. Coupon.  3. Continue to Treat and prevent reflux:   A. remain away from caffeine and chocolate  B. increase omeprazole to 40 mg 1 time per day  C. ranitidine 300 mg one tablet once a day  4. If needed:   A. Combivent 2 inhalations or albuterol nebulization every 4-6 hours  B. cetirizine 10 mg one tablet once a day  C. nasal saline wash multiple times a day  5. Return to clinic in December 2017 or earlier if problem  6. Obtain full flu vaccine

## 2016-04-27 NOTE — Progress Notes (Signed)
Follow-up Note  Referring Provider: Lillard Anes,* Primary Provider: Lillard Anes, MD Date of Office Visit: 04/27/2016  Subjective:   Nicholas Clay. (DOB: 1951-08-16) is a 65 y.o. 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  Utilizing initial therapy established 03/30/2016 he's had an excellent response. He feels like he is having much less issue with wheezing and coughing and he only uses a bronchodilator twice a week at this point. He is exercising without any difficulty. He continues to consistently use his Breo 200 one inhalation 1 time per day. Likewise, he's been doing very well with his upper airways while consistently using Flonase. Reflux has not been a particularly big problem at this point and his throat is doing well while he has eliminated all caffeine consumption and has remained on a proton pump inhibitor and a H2 receptor blocker.  Rowin will be participating in a cancer screening program with a chest CT scan in [facility].    Medication List      albuterol (2.5 MG/3ML) 0.083% nebulizer solution Commonly known as:  PROVENTIL Take 2.5 mg by nebulization every 4 (four) hours as needed for wheezing or shortness of breath.   albuterol-ipratropium 18-103 MCG/ACT inhaler Commonly known as:  COMBIVENT Inhale into the lungs every 4 (four) hours.   atorvastatin 10 MG tablet Commonly known as:  LIPITOR Take 10 mg by mouth daily.   Cetirizine HCl 10 MG Caps Take by mouth.   finasteride 5 MG tablet Commonly known as:  PROSCAR Take 5 mg by mouth daily.   FLUoxetine 20 MG capsule Commonly known as:  PROZAC Take 20 mg by mouth daily.   fluticasone 50 MCG/ACT nasal spray Commonly known as:  FLONASE   fluticasone furoate-vilanterol 200-25 MCG/INH Aepb Commonly known as:  BREO  ELLIPTA Inhale 1 puff into the lungs daily.   omeprazole 40 MG capsule Commonly known as:  PRILOSEC Take 1 capsule (40 mg total) by mouth daily.   ranitidine 300 MG capsule Commonly known as:  ZANTAC Take 1 capsule (300 mg total) by mouth every evening.   tamsulosin 0.4 MG Caps capsule Commonly known as:  FLOMAX Take 0.4 mg by mouth.       Past Medical History:  Diagnosis Date  . Asthma   . Emphysema of lung ([place])   . Irregular heart beat     Past Surgical History:  Procedure Laterality Date  . ADENOIDECTOMY    . EYE SURGERY    . HERNIA REPAIR    . Left ear deafness  1954  . PLASTIC SURGERY    . SINOSCOPY    . TONSIL SURGERY    . TONSILLECTOMY      No Known Allergies  Review of systems negative except as noted in HPI / PMHx or noted below:  Review of Systems  Constitutional: Negative.   HENT: Negative.   Eyes: Negative.   Respiratory: Negative.   Cardiovascular: Negative.   Gastrointestinal: Negative.   Genitourinary: Negative.   Musculoskeletal: Negative.   Skin: Negative.   Neurological: Negative.   Endo/Heme/Allergies: Negative.   Psychiatric/Behavioral: Negative.      Objective:   Vitals:   04/27/16 1521  BP: 120/72  Pulse: 64  Resp: 16          Physical Exam  Constitutional: He is well-developed, well-nourished, and  in no distress.  Baritone voice  HENT:  Head: Normocephalic.  Right Ear: Tympanic membrane, external ear and ear canal normal.  Left Ear: Tympanic membrane, external ear and ear canal normal.  Nose: Nose normal. No mucosal edema or rhinorrhea.  Mouth/Throat: Uvula is midline, oropharynx is clear and moist and mucous membranes are normal. No oropharyngeal exudate.  Blind ear canal left  Eyes: Conjunctivae are normal.  Neck: Trachea normal. No tracheal tenderness present. No tracheal deviation present. No thyromegaly present.  Cardiovascular: Normal rate, regular rhythm, S1 normal, S2 normal and normal heart sounds.   No  murmur heard. Pulmonary/Chest: Breath sounds normal. No stridor. No respiratory distress. He has no wheezes. He has no rales.  Musculoskeletal: He exhibits no edema.  Lymphadenopathy:       Head (right side): No tonsillar adenopathy present.       Head (left side): No tonsillar adenopathy present.    He has no cervical adenopathy.  Neurological: He is alert. Gait normal.  Skin: No rash noted. He is not diaphoretic. No erythema. Nails show no clubbing.  Psychiatric: Mood and affect normal.    Diagnostics:    Spirometry was performed and demonstrated an FEV1 of 2.45 at 70 % of predicted.  The patient had an Asthma Control Test with the following results: ACT Total Score: 14.    Assessment and Plan:   1. COPD with asthma ([place])   2. Allergic rhinoconjunctivitis   3. LPRD (laryngopharyngeal reflux disease)     1. Continue to perform Allergen avoidance measures  2. Continue to Treat and prevent inflammation:   A. Flonase one spray each nostril twice a day  B. Breo 200 one inhalation 1 time per day. Sample. Coupon.  3. Continue to Treat and prevent reflux:   A. remain away from caffeine and chocolate  B. increase omeprazole to 40 mg 1 time per day  C. ranitidine 300 mg one tablet once a day  4. If needed:   A. Combivent 2 inhalations or albuterol nebulization every 4-6 hours  B. cetirizine 10 mg one tablet once a day  C. nasal saline wash multiple times a day  5. Return to clinic in December 2017 or earlier if problem  6. Obtain full flu vaccine  Levester is doing very well at this point in time and I would like for him to remain on his current plan which includes anti-inflammatory agents for both his upper and lower airway and a little bit more aggressive therapy directed against his laryngopharyngeal reflux. He apparently is receiving immunotherapy from Dr. Carren Rang and he may do so at this point. I will see him back in this clinic in December 2017 or earlier if there is a  problem.  Allena Katz, MD [place]

## 2016-04-28 DIAGNOSIS — Z87891 Personal history of nicotine dependence: Secondary | ICD-10-CM | POA: Diagnosis not present

## 2016-05-02 DIAGNOSIS — Z6831 Body mass index (BMI) 31.0-31.9, adult: Secondary | ICD-10-CM | POA: Diagnosis not present

## 2016-05-02 DIAGNOSIS — S46919A Strain of unspecified muscle, fascia and tendon at shoulder and upper arm level, unspecified arm, initial encounter: Secondary | ICD-10-CM | POA: Diagnosis not present

## 2016-05-03 DIAGNOSIS — N3020 Other chronic cystitis without hematuria: Secondary | ICD-10-CM | POA: Diagnosis not present

## 2016-05-03 DIAGNOSIS — R972 Elevated prostate specific antigen [PSA]: Secondary | ICD-10-CM | POA: Diagnosis not present

## 2016-05-03 DIAGNOSIS — N401 Enlarged prostate with lower urinary tract symptoms: Secondary | ICD-10-CM | POA: Diagnosis not present

## 2016-05-03 DIAGNOSIS — N410 Acute prostatitis: Secondary | ICD-10-CM | POA: Diagnosis not present

## 2016-05-03 DIAGNOSIS — N529 Male erectile dysfunction, unspecified: Secondary | ICD-10-CM | POA: Diagnosis not present

## 2016-05-04 DIAGNOSIS — R972 Elevated prostate specific antigen [PSA]: Secondary | ICD-10-CM | POA: Diagnosis not present

## 2016-05-06 DIAGNOSIS — F331 Major depressive disorder, recurrent, moderate: Secondary | ICD-10-CM | POA: Diagnosis not present

## 2016-05-13 DIAGNOSIS — Z87891 Personal history of nicotine dependence: Secondary | ICD-10-CM | POA: Diagnosis not present

## 2016-05-13 DIAGNOSIS — G4733 Obstructive sleep apnea (adult) (pediatric): Secondary | ICD-10-CM | POA: Diagnosis not present

## 2016-05-13 DIAGNOSIS — J45909 Unspecified asthma, uncomplicated: Secondary | ICD-10-CM | POA: Diagnosis not present

## 2016-05-13 DIAGNOSIS — J449 Chronic obstructive pulmonary disease, unspecified: Secondary | ICD-10-CM | POA: Diagnosis not present

## 2016-05-25 DIAGNOSIS — Z6831 Body mass index (BMI) 31.0-31.9, adult: Secondary | ICD-10-CM | POA: Diagnosis not present

## 2016-05-25 DIAGNOSIS — S46919A Strain of unspecified muscle, fascia and tendon at shoulder and upper arm level, unspecified arm, initial encounter: Secondary | ICD-10-CM | POA: Diagnosis not present

## 2016-06-01 DIAGNOSIS — M25512 Pain in left shoulder: Secondary | ICD-10-CM | POA: Diagnosis not present

## 2016-06-09 DIAGNOSIS — M75102 Unspecified rotator cuff tear or rupture of left shoulder, not specified as traumatic: Secondary | ICD-10-CM | POA: Diagnosis not present

## 2016-06-09 DIAGNOSIS — M7552 Bursitis of left shoulder: Secondary | ICD-10-CM | POA: Diagnosis not present

## 2016-06-09 DIAGNOSIS — D179 Benign lipomatous neoplasm, unspecified: Secondary | ICD-10-CM | POA: Diagnosis not present

## 2016-06-09 DIAGNOSIS — M25512 Pain in left shoulder: Secondary | ICD-10-CM | POA: Diagnosis not present

## 2016-06-27 DIAGNOSIS — M25512 Pain in left shoulder: Secondary | ICD-10-CM | POA: Diagnosis not present

## 2016-06-27 DIAGNOSIS — M67912 Unspecified disorder of synovium and tendon, left shoulder: Secondary | ICD-10-CM | POA: Diagnosis not present

## 2016-08-16 DIAGNOSIS — R7989 Other specified abnormal findings of blood chemistry: Secondary | ICD-10-CM | POA: Diagnosis not present

## 2016-08-16 DIAGNOSIS — R079 Chest pain, unspecified: Secondary | ICD-10-CM | POA: Diagnosis not present

## 2016-08-16 DIAGNOSIS — Z87891 Personal history of nicotine dependence: Secondary | ICD-10-CM | POA: Diagnosis not present

## 2016-08-16 DIAGNOSIS — Z79899 Other long term (current) drug therapy: Secondary | ICD-10-CM | POA: Diagnosis not present

## 2016-08-16 DIAGNOSIS — R945 Abnormal results of liver function studies: Secondary | ICD-10-CM | POA: Diagnosis not present

## 2016-08-16 DIAGNOSIS — F329 Major depressive disorder, single episode, unspecified: Secondary | ICD-10-CM | POA: Diagnosis not present

## 2016-08-16 DIAGNOSIS — E7800 Pure hypercholesterolemia, unspecified: Secondary | ICD-10-CM | POA: Diagnosis not present

## 2016-08-16 DIAGNOSIS — R0600 Dyspnea, unspecified: Secondary | ICD-10-CM | POA: Diagnosis not present

## 2016-08-16 DIAGNOSIS — R072 Precordial pain: Secondary | ICD-10-CM | POA: Diagnosis not present

## 2016-08-16 DIAGNOSIS — J449 Chronic obstructive pulmonary disease, unspecified: Secondary | ICD-10-CM | POA: Diagnosis not present

## 2016-08-16 DIAGNOSIS — R0602 Shortness of breath: Secondary | ICD-10-CM | POA: Diagnosis not present

## 2016-08-16 DIAGNOSIS — R1013 Epigastric pain: Secondary | ICD-10-CM | POA: Diagnosis not present

## 2016-08-16 DIAGNOSIS — R0789 Other chest pain: Secondary | ICD-10-CM | POA: Diagnosis not present

## 2016-08-16 DIAGNOSIS — R110 Nausea: Secondary | ICD-10-CM | POA: Diagnosis not present

## 2016-08-17 DIAGNOSIS — R1013 Epigastric pain: Secondary | ICD-10-CM | POA: Diagnosis not present

## 2016-08-17 DIAGNOSIS — R079 Chest pain, unspecified: Secondary | ICD-10-CM | POA: Diagnosis not present

## 2016-08-17 DIAGNOSIS — R740 Nonspecific elevation of levels of transaminase and lactic acid dehydrogenase [LDH]: Secondary | ICD-10-CM | POA: Diagnosis not present

## 2016-08-17 DIAGNOSIS — R7989 Other specified abnormal findings of blood chemistry: Secondary | ICD-10-CM | POA: Diagnosis not present

## 2016-08-17 DIAGNOSIS — R0602 Shortness of breath: Secondary | ICD-10-CM | POA: Diagnosis not present

## 2016-08-19 DIAGNOSIS — Z6830 Body mass index (BMI) 30.0-30.9, adult: Secondary | ICD-10-CM | POA: Diagnosis not present

## 2016-08-19 DIAGNOSIS — K5900 Constipation, unspecified: Secondary | ICD-10-CM | POA: Diagnosis not present

## 2016-08-22 DIAGNOSIS — N538 Other male sexual dysfunction: Secondary | ICD-10-CM | POA: Diagnosis not present

## 2016-08-22 DIAGNOSIS — N401 Enlarged prostate with lower urinary tract symptoms: Secondary | ICD-10-CM | POA: Diagnosis not present

## 2016-08-22 DIAGNOSIS — N318 Other neuromuscular dysfunction of bladder: Secondary | ICD-10-CM | POA: Diagnosis not present

## 2016-08-22 DIAGNOSIS — N3020 Other chronic cystitis without hematuria: Secondary | ICD-10-CM | POA: Diagnosis not present

## 2016-08-22 DIAGNOSIS — R351 Nocturia: Secondary | ICD-10-CM | POA: Diagnosis not present

## 2016-08-22 DIAGNOSIS — R972 Elevated prostate specific antigen [PSA]: Secondary | ICD-10-CM | POA: Diagnosis not present

## 2016-08-24 DIAGNOSIS — R079 Chest pain, unspecified: Secondary | ICD-10-CM | POA: Diagnosis not present

## 2016-08-24 DIAGNOSIS — K5900 Constipation, unspecified: Secondary | ICD-10-CM | POA: Diagnosis not present

## 2016-08-24 DIAGNOSIS — R945 Abnormal results of liver function studies: Secondary | ICD-10-CM | POA: Diagnosis not present

## 2016-08-24 DIAGNOSIS — R748 Abnormal levels of other serum enzymes: Secondary | ICD-10-CM | POA: Diagnosis not present

## 2016-08-29 ENCOUNTER — Ambulatory Visit: Payer: Commercial Managed Care - HMO | Admitting: Allergy and Immunology

## 2016-09-08 DIAGNOSIS — J441 Chronic obstructive pulmonary disease with (acute) exacerbation: Secondary | ICD-10-CM | POA: Diagnosis not present

## 2016-09-08 DIAGNOSIS — Z6831 Body mass index (BMI) 31.0-31.9, adult: Secondary | ICD-10-CM | POA: Diagnosis not present

## 2016-09-14 DIAGNOSIS — J449 Chronic obstructive pulmonary disease, unspecified: Secondary | ICD-10-CM | POA: Diagnosis not present

## 2016-09-14 DIAGNOSIS — R911 Solitary pulmonary nodule: Secondary | ICD-10-CM | POA: Insufficient documentation

## 2016-09-14 DIAGNOSIS — J45909 Unspecified asthma, uncomplicated: Secondary | ICD-10-CM | POA: Diagnosis not present

## 2016-09-14 DIAGNOSIS — E669 Obesity, unspecified: Secondary | ICD-10-CM | POA: Diagnosis not present

## 2016-09-14 DIAGNOSIS — G4733 Obstructive sleep apnea (adult) (pediatric): Secondary | ICD-10-CM | POA: Diagnosis not present

## 2016-09-14 DIAGNOSIS — Z87891 Personal history of nicotine dependence: Secondary | ICD-10-CM | POA: Diagnosis not present

## 2016-09-14 HISTORY — DX: Solitary pulmonary nodule: R91.1

## 2016-09-22 ENCOUNTER — Ambulatory Visit: Payer: Commercial Managed Care - HMO | Admitting: Allergy and Immunology

## 2016-09-22 DIAGNOSIS — J441 Chronic obstructive pulmonary disease with (acute) exacerbation: Secondary | ICD-10-CM | POA: Diagnosis not present

## 2016-09-22 DIAGNOSIS — Z6831 Body mass index (BMI) 31.0-31.9, adult: Secondary | ICD-10-CM | POA: Diagnosis not present

## 2016-09-30 ENCOUNTER — Encounter: Payer: Self-pay | Admitting: Allergy and Immunology

## 2016-09-30 ENCOUNTER — Ambulatory Visit (INDEPENDENT_AMBULATORY_CARE_PROVIDER_SITE_OTHER): Payer: Medicare HMO | Admitting: Allergy and Immunology

## 2016-09-30 VITALS — BP 132/78 | HR 60 | Resp 20

## 2016-09-30 DIAGNOSIS — J449 Chronic obstructive pulmonary disease, unspecified: Secondary | ICD-10-CM

## 2016-09-30 DIAGNOSIS — K219 Gastro-esophageal reflux disease without esophagitis: Secondary | ICD-10-CM | POA: Diagnosis not present

## 2016-09-30 DIAGNOSIS — J3089 Other allergic rhinitis: Secondary | ICD-10-CM | POA: Diagnosis not present

## 2016-09-30 NOTE — Progress Notes (Signed)
Follow-up Note  Referring Provider: Lillard Anes,* Primary Provider: Lillard Anes, MD Date of Office Visit: 09/30/2016  Subjective:   Nicholas Clay. (DOB: [birth year]) is a 66 y.o. male who returns to the [facility] on 09/30/2016 in re-evaluation of the following:  HPI: Martise presents to this clinic in evaluation of his COPD with asthma and allergic rhinoconjunctivitis and LPR. I have not seen him in this clinic since August 2017.  He was doing wonderful regarding his respiratory tract disease while consistently using Advair mostly one time per day and using a nasal steroid and treating his reflux aggressively. He had very little issues with his nose or chest and he did not have many issues with his throat or classic reflux symptoms. His requirement for short acting bronchodilator was less than 1 time per day. Usually he would utilize his DuoNeb nebulization a few times per week to start the day.  Unfortunately, sometime over the course of the past 2 weeks he developed an episode of feeling feverish and achy and nasal congestion and sneezing and rhinorrhea and coughing and wheezing for which he saw his primary care physician and was treated with an antibiotic and subsequently saw a pulmonologist in [facility] who treated him with oral prednisone and subsequently saw Dr. Henrene Pastor this past Monday who gave him a shot of steroids.  Allergies as of 09/30/2016   No Known Allergies     Medication List      ADVAIR DISKUS 250-50 MCG/DOSE Aepb Generic drug:  Fluticasone-Salmeterol Inhale 1 puff into the lungs 2 (two) times daily.   albuterol-ipratropium 18-103 MCG/ACT inhaler Commonly known as:  COMBIVENT Inhale into the lungs every 4 (four) hours.   ipratropium-albuterol 0.5-2.5 (3) MG/3ML Soln Commonly known as:  DUONEB Take 3 mLs by nebulization every 4 (four) hours as needed.   Cetirizine HCl 10 MG Caps Take by mouth.   finasteride 5 MG  tablet Commonly known as:  PROSCAR Take 5 mg by mouth daily.   FLUoxetine 20 MG capsule Commonly known as:  PROZAC Take 20 mg by mouth daily.   fluticasone 50 MCG/ACT nasal spray Commonly known as:  FLONASE   omeprazole 40 MG capsule Commonly known as:  PRILOSEC Take 1 capsule (40 mg total) by mouth daily.   ranitidine 300 MG capsule Commonly known as:  ZANTAC Take 1 capsule (300 mg total) by mouth every evening.   tamsulosin 0.4 MG Caps capsule Commonly known as:  FLOMAX Take 0.4 mg by mouth.       Past Medical History:  Diagnosis Date  . Asthma   . Emphysema of lung ([place])   . Irregular heart beat     Past Surgical History:  Procedure Laterality Date  . ADENOIDECTOMY    . EYE SURGERY    . HERNIA REPAIR    . Left ear deafness  1954  . PLASTIC SURGERY    . SINOSCOPY    . TONSIL SURGERY    . TONSILLECTOMY      Review of systems negative except as noted in HPI / PMHx or noted below:  Review of Systems  Constitutional: Negative.   HENT: Negative.   Eyes: Negative.   Respiratory: Negative.   Cardiovascular: Negative.   Gastrointestinal: Negative.   Genitourinary: Negative.   Musculoskeletal: Negative.   Skin: Negative.   Neurological: Negative.   Endo/Heme/Allergies: Negative.   Psychiatric/Behavioral: Negative.      Objective:   Vitals:   09/30/16 1132  BP: 132/78  Pulse: 60  Resp: 20          Physical Exam  Constitutional: He is well-developed, well-nourished, and in no distress.  Nasal voice  HENT:  Head: Normocephalic.  Right Ear: Tympanic membrane, external ear and ear canal normal.  Left Ear: Tympanic membrane, external ear and ear canal normal.  Nose: Nose normal. No mucosal edema or rhinorrhea.  Mouth/Throat: Uvula is midline, oropharynx is clear and moist and mucous membranes are normal. No oropharyngeal exudate.  Postsurgical changes of left external auditory canal  Eyes: Conjunctivae are normal.  Neck: Trachea normal. No  tracheal tenderness present. No tracheal deviation present. No thyromegaly present.  Cardiovascular: Normal rate, regular rhythm, S1 normal, S2 normal and normal heart sounds.   No murmur heard. Pulmonary/Chest: Breath sounds normal. No stridor. No respiratory distress. He has no wheezes. He has no rales.  Musculoskeletal: He exhibits no edema.  Lymphadenopathy:       Head (right side): No tonsillar adenopathy present.       Head (left side): No tonsillar adenopathy present.    He has no cervical adenopathy.  Neurological: He is alert. Gait normal.  Skin: No rash noted. He is not diaphoretic. No erythema. Nails show no clubbing.  Psychiatric: Mood and affect normal.    Diagnostics: Results of a chest CT scan obtained 04/28/2016 identified the following:  Cardiovascular: The heart is normal in size. No pericardial effusion. Mild coronary atherosclerosis in the LAD. No evidence of thoracic aortic aneurysm. Mediastinum/Nodes: Small mediastinal lymph nodes measuring up to 7 mm short axis, which does not meet pathologic CT size criteria. Visualized thyroid is unremarkable. Lungs/Pleura: 4.2 mm (volumetric mean) subpleural nodule in the posterior right upper lobe (series 3/ image 48). Lungs are otherwise clear. No focal consolidation. No pleural effusion or pneumothorax.   Spirometry was performed and demonstrated an FEV1 of 2.07 at 59 % of predicted.  The patient had an Asthma Control Test with the following results:  .    Assessment and Plan:   1. COPD with asthma ([place])   2. Other allergic rhinitis   3. LPRD (laryngopharyngeal reflux disease)     1. Continue to perform Allergen avoidance measures  2. Continue to Treat and prevent inflammation:   A. Flonase one spray each nostril twice a day  B. Advair 250 diskus one inhalation 1-2 times per day  3. Continue to Treat and prevent reflux:   A. remain away from caffeine and chocolate  B. omeprazole to 40 mg 1 time per day in  AM  C. ranitidine 300 mg one tablet once a day in PM  4. If needed:   A. Combivent 2 inhalations or Duoneb nebulization every 4-6 hours  B. cetirizine 10 mg one tablet once a day  C. nasal saline wash multiple times a day  D. OTC Mucinex DM two tablet two times per day  5. Return to clinic in 6 months or earlier if problem   Overall Mansour was doing relatively well until he contracted what appears to be a respiratory tract infection that resulted in significant inflammation of both his upper and lower airway. He is improving regarding this issue and I am not going to administer any additional antibiotics or systemic steroids at this point in time and just assume that over the course of the next 2 weeks he will return back to his previous functioning state which was very good. I will be available should he have any significant problems with  the plan mentioned above but otherwise had like to see him back in this clinic in 6 months.  Allena Katz, MD [place]

## 2016-09-30 NOTE — Patient Instructions (Addendum)
  1. Continue to perform Allergen avoidance measures  2. Continue to Treat and prevent inflammation:   A. Flonase one spray each nostril twice a day  B. Advair 250 diskus one inhalation 1-2 times per day  3. Continue to Treat and prevent reflux:   A. remain away from caffeine and chocolate  B. omeprazole to 40 mg 1 time per day  C. ranitidine 300 mg one tablet once a day  4. If needed:   A. Combivent 2 inhalations or Duoneb nebulization every 4-6 hours  B. cetirizine 10 mg one tablet once a day  C. nasal saline wash multiple times a day  D. OTC Mucinex DM two tablet two times per day  5. Return to clinic in 6 months or earlier if problem

## 2016-10-26 DIAGNOSIS — F331 Major depressive disorder, recurrent, moderate: Secondary | ICD-10-CM | POA: Diagnosis not present

## 2016-11-01 DIAGNOSIS — F331 Major depressive disorder, recurrent, moderate: Secondary | ICD-10-CM | POA: Diagnosis not present

## 2016-11-08 DIAGNOSIS — F331 Major depressive disorder, recurrent, moderate: Secondary | ICD-10-CM | POA: Diagnosis not present

## 2016-11-09 ENCOUNTER — Other Ambulatory Visit: Payer: Self-pay | Admitting: Allergy and Immunology

## 2016-12-14 DIAGNOSIS — F331 Major depressive disorder, recurrent, moderate: Secondary | ICD-10-CM | POA: Diagnosis not present

## 2017-01-09 DIAGNOSIS — M7062 Trochanteric bursitis, left hip: Secondary | ICD-10-CM | POA: Diagnosis not present

## 2017-01-10 DIAGNOSIS — M6281 Muscle weakness (generalized): Secondary | ICD-10-CM | POA: Diagnosis not present

## 2017-01-10 DIAGNOSIS — M25552 Pain in left hip: Secondary | ICD-10-CM | POA: Diagnosis not present

## 2017-01-10 DIAGNOSIS — R2689 Other abnormalities of gait and mobility: Secondary | ICD-10-CM | POA: Diagnosis not present

## 2017-01-12 DIAGNOSIS — M25552 Pain in left hip: Secondary | ICD-10-CM | POA: Diagnosis not present

## 2017-01-12 DIAGNOSIS — M6281 Muscle weakness (generalized): Secondary | ICD-10-CM | POA: Diagnosis not present

## 2017-01-12 DIAGNOSIS — R2689 Other abnormalities of gait and mobility: Secondary | ICD-10-CM | POA: Diagnosis not present

## 2017-01-12 DIAGNOSIS — G4733 Obstructive sleep apnea (adult) (pediatric): Secondary | ICD-10-CM | POA: Diagnosis not present

## 2017-01-17 DIAGNOSIS — M25552 Pain in left hip: Secondary | ICD-10-CM | POA: Diagnosis not present

## 2017-01-17 DIAGNOSIS — M6281 Muscle weakness (generalized): Secondary | ICD-10-CM | POA: Diagnosis not present

## 2017-01-17 DIAGNOSIS — R2689 Other abnormalities of gait and mobility: Secondary | ICD-10-CM | POA: Diagnosis not present

## 2017-01-19 DIAGNOSIS — R2689 Other abnormalities of gait and mobility: Secondary | ICD-10-CM | POA: Diagnosis not present

## 2017-01-19 DIAGNOSIS — M25552 Pain in left hip: Secondary | ICD-10-CM | POA: Diagnosis not present

## 2017-01-19 DIAGNOSIS — M6281 Muscle weakness (generalized): Secondary | ICD-10-CM | POA: Diagnosis not present

## 2017-01-24 DIAGNOSIS — R2689 Other abnormalities of gait and mobility: Secondary | ICD-10-CM | POA: Diagnosis not present

## 2017-01-24 DIAGNOSIS — M6281 Muscle weakness (generalized): Secondary | ICD-10-CM | POA: Diagnosis not present

## 2017-01-24 DIAGNOSIS — M25552 Pain in left hip: Secondary | ICD-10-CM | POA: Diagnosis not present

## 2017-01-26 DIAGNOSIS — M6281 Muscle weakness (generalized): Secondary | ICD-10-CM | POA: Diagnosis not present

## 2017-01-26 DIAGNOSIS — M25552 Pain in left hip: Secondary | ICD-10-CM | POA: Diagnosis not present

## 2017-01-26 DIAGNOSIS — R2689 Other abnormalities of gait and mobility: Secondary | ICD-10-CM | POA: Diagnosis not present

## 2017-01-31 DIAGNOSIS — M6281 Muscle weakness (generalized): Secondary | ICD-10-CM | POA: Diagnosis not present

## 2017-01-31 DIAGNOSIS — M25552 Pain in left hip: Secondary | ICD-10-CM | POA: Diagnosis not present

## 2017-01-31 DIAGNOSIS — R2689 Other abnormalities of gait and mobility: Secondary | ICD-10-CM | POA: Diagnosis not present

## 2017-02-02 DIAGNOSIS — R2689 Other abnormalities of gait and mobility: Secondary | ICD-10-CM | POA: Diagnosis not present

## 2017-02-02 DIAGNOSIS — M6281 Muscle weakness (generalized): Secondary | ICD-10-CM | POA: Diagnosis not present

## 2017-02-02 DIAGNOSIS — M25552 Pain in left hip: Secondary | ICD-10-CM | POA: Diagnosis not present

## 2017-02-07 DIAGNOSIS — M6281 Muscle weakness (generalized): Secondary | ICD-10-CM | POA: Diagnosis not present

## 2017-02-07 DIAGNOSIS — M25552 Pain in left hip: Secondary | ICD-10-CM | POA: Diagnosis not present

## 2017-02-07 DIAGNOSIS — R2689 Other abnormalities of gait and mobility: Secondary | ICD-10-CM | POA: Diagnosis not present

## 2017-02-09 DIAGNOSIS — M6281 Muscle weakness (generalized): Secondary | ICD-10-CM | POA: Diagnosis not present

## 2017-02-09 DIAGNOSIS — M25552 Pain in left hip: Secondary | ICD-10-CM | POA: Diagnosis not present

## 2017-02-09 DIAGNOSIS — R2689 Other abnormalities of gait and mobility: Secondary | ICD-10-CM | POA: Diagnosis not present

## 2017-02-14 DIAGNOSIS — M25552 Pain in left hip: Secondary | ICD-10-CM | POA: Diagnosis not present

## 2017-02-14 DIAGNOSIS — M6281 Muscle weakness (generalized): Secondary | ICD-10-CM | POA: Diagnosis not present

## 2017-02-14 DIAGNOSIS — R2689 Other abnormalities of gait and mobility: Secondary | ICD-10-CM | POA: Diagnosis not present

## 2017-02-16 DIAGNOSIS — R2689 Other abnormalities of gait and mobility: Secondary | ICD-10-CM | POA: Diagnosis not present

## 2017-02-16 DIAGNOSIS — M6281 Muscle weakness (generalized): Secondary | ICD-10-CM | POA: Diagnosis not present

## 2017-02-16 DIAGNOSIS — M25552 Pain in left hip: Secondary | ICD-10-CM | POA: Diagnosis not present

## 2017-02-20 DIAGNOSIS — M25552 Pain in left hip: Secondary | ICD-10-CM | POA: Diagnosis not present

## 2017-02-20 DIAGNOSIS — M6281 Muscle weakness (generalized): Secondary | ICD-10-CM | POA: Diagnosis not present

## 2017-02-20 DIAGNOSIS — R2689 Other abnormalities of gait and mobility: Secondary | ICD-10-CM | POA: Diagnosis not present

## 2017-02-23 DIAGNOSIS — M25552 Pain in left hip: Secondary | ICD-10-CM | POA: Diagnosis not present

## 2017-02-23 DIAGNOSIS — R2689 Other abnormalities of gait and mobility: Secondary | ICD-10-CM | POA: Diagnosis not present

## 2017-02-23 DIAGNOSIS — M6281 Muscle weakness (generalized): Secondary | ICD-10-CM | POA: Diagnosis not present

## 2017-02-24 DIAGNOSIS — F331 Major depressive disorder, recurrent, moderate: Secondary | ICD-10-CM | POA: Diagnosis not present

## 2017-02-28 DIAGNOSIS — R2689 Other abnormalities of gait and mobility: Secondary | ICD-10-CM | POA: Diagnosis not present

## 2017-02-28 DIAGNOSIS — M6281 Muscle weakness (generalized): Secondary | ICD-10-CM | POA: Diagnosis not present

## 2017-02-28 DIAGNOSIS — M25552 Pain in left hip: Secondary | ICD-10-CM | POA: Diagnosis not present

## 2017-03-02 DIAGNOSIS — R2689 Other abnormalities of gait and mobility: Secondary | ICD-10-CM | POA: Diagnosis not present

## 2017-03-02 DIAGNOSIS — M6281 Muscle weakness (generalized): Secondary | ICD-10-CM | POA: Diagnosis not present

## 2017-03-02 DIAGNOSIS — M25552 Pain in left hip: Secondary | ICD-10-CM | POA: Diagnosis not present

## 2017-03-07 DIAGNOSIS — R2689 Other abnormalities of gait and mobility: Secondary | ICD-10-CM | POA: Diagnosis not present

## 2017-03-07 DIAGNOSIS — M6281 Muscle weakness (generalized): Secondary | ICD-10-CM | POA: Diagnosis not present

## 2017-03-07 DIAGNOSIS — M25552 Pain in left hip: Secondary | ICD-10-CM | POA: Diagnosis not present

## 2017-03-27 DIAGNOSIS — J019 Acute sinusitis, unspecified: Secondary | ICD-10-CM | POA: Diagnosis not present

## 2017-03-27 DIAGNOSIS — Z6833 Body mass index (BMI) 33.0-33.9, adult: Secondary | ICD-10-CM | POA: Diagnosis not present

## 2017-03-30 ENCOUNTER — Encounter: Payer: Self-pay | Admitting: Allergy and Immunology

## 2017-03-30 ENCOUNTER — Ambulatory Visit (INDEPENDENT_AMBULATORY_CARE_PROVIDER_SITE_OTHER): Payer: Medicare HMO | Admitting: Allergy and Immunology

## 2017-03-30 VITALS — BP 116/80 | HR 60 | Resp 16

## 2017-03-30 DIAGNOSIS — K219 Gastro-esophageal reflux disease without esophagitis: Secondary | ICD-10-CM | POA: Diagnosis not present

## 2017-03-30 DIAGNOSIS — J449 Chronic obstructive pulmonary disease, unspecified: Secondary | ICD-10-CM

## 2017-03-30 DIAGNOSIS — J3089 Other allergic rhinitis: Secondary | ICD-10-CM

## 2017-03-30 NOTE — Progress Notes (Signed)
Follow-up Note  Referring Provider: Lillard Anes,* Primary Provider: Lillard Anes, MD Date of Office Visit: 03/30/2017  Subjective:   Nicholas Clay. (DOB: 04-10-1951) is a 66 y.o. male who returns to the [facility] on 03/30/2017 in re-evaluation of the following:  HPI: Nicholas Clay presents to this clinic in reevaluation of his COPD with asthma and allergic rhinoconjunctivitis and LPR. His last visit to this clinic was January 2018.  He has really done quite well during the interval until the past week. Prior to that point in time he really had very little issues with his nose or chest or throat or reflux and overall felt that his plan was really working quite well. He did not require systemic steroid or an antibiotic to treat any type of respiratory tract issue and he rarely uses any short acting bronchodilator.  He has been visiting a friend in the hospital over the course of the past few weeks and last week he developed nasal congestion and head fullness and a little bit of cough and some loss of smell. He has been started on an antibiotic and he is improving. He did use nebulized albuterol during this timeframe. It does not sound as though he has required a systemic steroid.  Allergies as of 03/30/2017   No Known Allergies     Medication List      ADVAIR DISKUS 250-50 MCG/DOSE Aepb Generic drug:  Fluticasone-Salmeterol Inhale 1 puff into the lungs 2 (two) times daily.   albuterol-ipratropium 18-103 MCG/ACT inhaler Commonly known as:  COMBIVENT Inhale into the lungs every 4 (four) hours.   ipratropium-albuterol 0.5-2.5 (3) MG/3ML Soln Commonly known as:  DUONEB Take 3 mLs by nebulization every 4 (four) hours as needed.   amoxicillin 875 MG tablet Commonly known as:  AMOXIL Take 875 mg by mouth 2 (two) times daily.   Cetirizine HCl 10 MG Caps Take by mouth.   finasteride 5 MG tablet Commonly known as:  PROSCAR Take 5 mg by mouth daily.   FLUoxetine 20 MG capsule Commonly known as:  PROZAC Take 20 mg by mouth daily.   fluticasone 50 MCG/ACT nasal spray Commonly known as:  FLONASE   omeprazole 40 MG capsule Commonly known as:  PRILOSEC TAKE 1 CAPSULE BY MOUTH DAILY   ranitidine 300 MG capsule Commonly known as:  ZANTAC Take 1 capsule (300 mg total) by mouth every evening.   tamsulosin 0.4 MG Caps capsule Commonly known as:  FLOMAX Take 0.4 mg by mouth.       Past Medical History:  Diagnosis Date  . Asthma   . Emphysema of lung ([place])   . Irregular heart beat     Past Surgical History:  Procedure Laterality Date  . ADENOIDECTOMY    . EYE SURGERY    . HERNIA REPAIR    . Left ear deafness  1954  . PLASTIC SURGERY    . SINOSCOPY    . TONSIL SURGERY    . TONSILLECTOMY      Review of systems negative except as noted in HPI / PMHx or noted below:  Review of Systems  Constitutional: Negative.   HENT: Negative.   Eyes: Negative.   Respiratory:       Sleep apnea treated with CPAP  Cardiovascular: Negative.   Gastrointestinal: Negative.   Genitourinary: Negative.   Musculoskeletal: Negative.   Skin: Negative.   Neurological: Negative.   Endo/Heme/Allergies: Negative.   Psychiatric/Behavioral: Negative.  Objective:   Vitals:   03/30/17 1507  BP: 116/80  Pulse: 60  Resp: 16          Physical Exam  Constitutional: He is well-developed, well-nourished, and in no distress.  Throat clearing, slight cough, nasal voice  HENT:  Head: Normocephalic.  Right Ear: Tympanic membrane, external ear and ear canal normal.  Left Ear: External ear normal.  Nose: Mucosal edema present. No rhinorrhea.  Mouth/Throat: Uvula is midline, oropharynx is clear and moist and mucous membranes are normal. No oropharyngeal exudate.  Postsurgical changes left external auditory canal  Eyes: Conjunctivae are normal.  Neck: Trachea normal. No tracheal tenderness present. No tracheal deviation present. No  thyromegaly present.  Cardiovascular: Normal rate, regular rhythm, S1 normal, S2 normal and normal heart sounds.   No murmur heard. Pulmonary/Chest: Breath sounds normal. No stridor. No respiratory distress. He has no wheezes. He has no rales.  Musculoskeletal: He exhibits no edema.  Lymphadenopathy:       Head (right side): No tonsillar adenopathy present.       Head (left side): No tonsillar adenopathy present.    He has no cervical adenopathy.  Neurological: He is alert. Gait normal.  Skin: No rash noted. He is not diaphoretic. No erythema. Nails show no clubbing.  Psychiatric: Mood and affect normal.    Diagnostics:    Spirometry was performed and demonstrated an FEV1 of 2.50 at 72 % of predicted.  The patient had an Asthma Control Test with the following results: ACT Total Score: 13.    Assessment and Plan:   1. COPD with asthma ([place])   2. Other allergic rhinitis   3. LPRD (laryngopharyngeal reflux disease)     1. Continue to perform Allergen avoidance measures  2. Continue to Treat and prevent inflammation:   A. Flonase one spray each nostril twice a day  B. Advair 250 diskus one inhalation 1-2 times per day  3. Continue to Treat and prevent reflux:   A. remain away from caffeine and chocolate  B. omeprazole to 40 mg 1 time per day  C. ranitidine 300 mg one tablet once a day  4. If needed:   A. Combivent 2 inhalations or Duoneb nebulization every 4-6 hours  B. cetirizine 10 mg one tablet once a day  C. nasal saline wash multiple times a day  D. OTC Mucinex DM two tablet two times per day  5. Return to clinic in 6 months or earlier if problem  6. Obtain a flu vaccine   Nicholas Clay appears to have a recent viral or possibly bacterial respiratory tract infection that appears to be improving on his current therapy and I suspect that within another 4-5 days most of this should have resolved. Overall he has done very well using his current medical plan which includes  anti-inflammatory agents for his upper and lower airway and therapy directed against reflux. If he continues to do this well I will just see him back in this clinic in 6 months or earlier if there is a problem.  Allena Katz, MD Allergy / Immunology [place]

## 2017-03-30 NOTE — Patient Instructions (Addendum)
  1. Continue to perform Allergen avoidance measures  2. Continue to Treat and prevent inflammation:   A. Flonase one spray each nostril twice a day  B. Advair 250 diskus one inhalation 1-2 times per day  3. Continue to Treat and prevent reflux:   A. remain away from caffeine and chocolate  B. omeprazole to 40 mg 1 time per day  C. ranitidine 300 mg one tablet once a day  4. If needed:   A. Combivent 2 inhalations or Duoneb nebulization every 4-6 hours  B. cetirizine 10 mg one tablet once a day  C. nasal saline wash multiple times a day  D. OTC Mucinex DM two tablet two times per day  5. Return to clinic in 6 months or earlier if problem  6. Obtain a flu vaccine

## 2017-05-03 DIAGNOSIS — Z125 Encounter for screening for malignant neoplasm of prostate: Secondary | ICD-10-CM | POA: Diagnosis not present

## 2017-05-03 DIAGNOSIS — J449 Chronic obstructive pulmonary disease, unspecified: Secondary | ICD-10-CM | POA: Diagnosis not present

## 2017-05-03 DIAGNOSIS — E785 Hyperlipidemia, unspecified: Secondary | ICD-10-CM | POA: Diagnosis not present

## 2017-05-03 DIAGNOSIS — J329 Chronic sinusitis, unspecified: Secondary | ICD-10-CM | POA: Diagnosis not present

## 2017-05-03 DIAGNOSIS — Z6833 Body mass index (BMI) 33.0-33.9, adult: Secondary | ICD-10-CM | POA: Diagnosis not present

## 2017-05-03 DIAGNOSIS — G4730 Sleep apnea, unspecified: Secondary | ICD-10-CM | POA: Diagnosis not present

## 2017-05-09 DIAGNOSIS — J441 Chronic obstructive pulmonary disease with (acute) exacerbation: Secondary | ICD-10-CM | POA: Diagnosis not present

## 2017-05-09 DIAGNOSIS — Z6833 Body mass index (BMI) 33.0-33.9, adult: Secondary | ICD-10-CM | POA: Diagnosis not present

## 2017-05-17 DIAGNOSIS — F331 Major depressive disorder, recurrent, moderate: Secondary | ICD-10-CM | POA: Diagnosis not present

## 2017-05-19 DIAGNOSIS — N3020 Other chronic cystitis without hematuria: Secondary | ICD-10-CM | POA: Diagnosis not present

## 2017-05-19 DIAGNOSIS — R972 Elevated prostate specific antigen [PSA]: Secondary | ICD-10-CM | POA: Diagnosis not present

## 2017-05-19 DIAGNOSIS — N401 Enlarged prostate with lower urinary tract symptoms: Secondary | ICD-10-CM | POA: Diagnosis not present

## 2017-05-19 DIAGNOSIS — N318 Other neuromuscular dysfunction of bladder: Secondary | ICD-10-CM | POA: Diagnosis not present

## 2017-05-23 DIAGNOSIS — G4733 Obstructive sleep apnea (adult) (pediatric): Secondary | ICD-10-CM | POA: Diagnosis not present

## 2017-05-30 DIAGNOSIS — N390 Urinary tract infection, site not specified: Secondary | ICD-10-CM | POA: Diagnosis not present

## 2017-05-30 DIAGNOSIS — Z6833 Body mass index (BMI) 33.0-33.9, adult: Secondary | ICD-10-CM | POA: Diagnosis not present

## 2017-05-30 DIAGNOSIS — R8299 Other abnormal findings in urine: Secondary | ICD-10-CM | POA: Diagnosis not present

## 2017-05-30 DIAGNOSIS — L500 Allergic urticaria: Secondary | ICD-10-CM | POA: Diagnosis not present

## 2017-06-10 DIAGNOSIS — R8299 Other abnormal findings in urine: Secondary | ICD-10-CM | POA: Diagnosis not present

## 2017-06-10 DIAGNOSIS — L239 Allergic contact dermatitis, unspecified cause: Secondary | ICD-10-CM | POA: Diagnosis not present

## 2017-06-10 DIAGNOSIS — R109 Unspecified abdominal pain: Secondary | ICD-10-CM | POA: Diagnosis not present

## 2017-06-10 DIAGNOSIS — L500 Allergic urticaria: Secondary | ICD-10-CM | POA: Diagnosis not present

## 2017-06-10 DIAGNOSIS — Z6832 Body mass index (BMI) 32.0-32.9, adult: Secondary | ICD-10-CM | POA: Diagnosis not present

## 2017-06-10 DIAGNOSIS — R822 Biliuria: Secondary | ICD-10-CM | POA: Diagnosis not present

## 2017-06-13 DIAGNOSIS — R748 Abnormal levels of other serum enzymes: Secondary | ICD-10-CM | POA: Diagnosis not present

## 2017-06-16 DIAGNOSIS — R945 Abnormal results of liver function studies: Secondary | ICD-10-CM | POA: Diagnosis not present

## 2017-06-16 DIAGNOSIS — J449 Chronic obstructive pulmonary disease, unspecified: Secondary | ICD-10-CM | POA: Diagnosis not present

## 2017-06-16 DIAGNOSIS — L299 Pruritus, unspecified: Secondary | ICD-10-CM | POA: Diagnosis not present

## 2017-06-16 DIAGNOSIS — K760 Fatty (change of) liver, not elsewhere classified: Secondary | ICD-10-CM | POA: Diagnosis not present

## 2017-06-16 DIAGNOSIS — Z87891 Personal history of nicotine dependence: Secondary | ICD-10-CM | POA: Diagnosis not present

## 2017-06-29 DIAGNOSIS — R10816 Epigastric abdominal tenderness: Secondary | ICD-10-CM | POA: Diagnosis not present

## 2017-06-29 DIAGNOSIS — R1011 Right upper quadrant pain: Secondary | ICD-10-CM | POA: Diagnosis not present

## 2017-06-29 DIAGNOSIS — J449 Chronic obstructive pulmonary disease, unspecified: Secondary | ICD-10-CM | POA: Diagnosis not present

## 2017-06-29 DIAGNOSIS — L505 Cholinergic urticaria: Secondary | ICD-10-CM | POA: Diagnosis not present

## 2017-06-29 DIAGNOSIS — R319 Hematuria, unspecified: Secondary | ICD-10-CM | POA: Diagnosis not present

## 2017-07-06 DIAGNOSIS — R10816 Epigastric abdominal tenderness: Secondary | ICD-10-CM | POA: Diagnosis not present

## 2017-07-06 DIAGNOSIS — J449 Chronic obstructive pulmonary disease, unspecified: Secondary | ICD-10-CM | POA: Diagnosis not present

## 2017-07-06 DIAGNOSIS — R319 Hematuria, unspecified: Secondary | ICD-10-CM | POA: Diagnosis not present

## 2017-07-06 DIAGNOSIS — L505 Cholinergic urticaria: Secondary | ICD-10-CM | POA: Diagnosis not present

## 2017-07-06 DIAGNOSIS — R1011 Right upper quadrant pain: Secondary | ICD-10-CM | POA: Diagnosis not present

## 2017-07-12 ENCOUNTER — Encounter: Payer: Self-pay | Admitting: Allergy and Immunology

## 2017-07-20 DIAGNOSIS — Z01810 Encounter for preprocedural cardiovascular examination: Secondary | ICD-10-CM | POA: Diagnosis not present

## 2017-07-20 DIAGNOSIS — R001 Bradycardia, unspecified: Secondary | ICD-10-CM | POA: Diagnosis not present

## 2017-07-24 DIAGNOSIS — Z9049 Acquired absence of other specified parts of digestive tract: Secondary | ICD-10-CM | POA: Diagnosis not present

## 2017-07-24 DIAGNOSIS — K420 Umbilical hernia with obstruction, without gangrene: Secondary | ICD-10-CM | POA: Diagnosis not present

## 2017-07-24 DIAGNOSIS — K429 Umbilical hernia without obstruction or gangrene: Secondary | ICD-10-CM | POA: Diagnosis not present

## 2017-07-24 DIAGNOSIS — J449 Chronic obstructive pulmonary disease, unspecified: Secondary | ICD-10-CM | POA: Diagnosis not present

## 2017-07-24 DIAGNOSIS — R1011 Right upper quadrant pain: Secondary | ICD-10-CM | POA: Diagnosis not present

## 2017-07-24 DIAGNOSIS — K819 Cholecystitis, unspecified: Secondary | ICD-10-CM | POA: Diagnosis not present

## 2017-07-24 DIAGNOSIS — K8010 Calculus of gallbladder with chronic cholecystitis without obstruction: Secondary | ICD-10-CM | POA: Diagnosis not present

## 2017-07-24 DIAGNOSIS — L509 Urticaria, unspecified: Secondary | ICD-10-CM | POA: Diagnosis not present

## 2017-07-24 DIAGNOSIS — R945 Abnormal results of liver function studies: Secondary | ICD-10-CM | POA: Diagnosis not present

## 2017-07-25 DIAGNOSIS — N318 Other neuromuscular dysfunction of bladder: Secondary | ICD-10-CM | POA: Diagnosis not present

## 2017-07-25 DIAGNOSIS — N3020 Other chronic cystitis without hematuria: Secondary | ICD-10-CM | POA: Diagnosis not present

## 2017-07-25 DIAGNOSIS — N3090 Cystitis, unspecified without hematuria: Secondary | ICD-10-CM | POA: Diagnosis not present

## 2017-07-31 ENCOUNTER — Other Ambulatory Visit: Payer: Self-pay | Admitting: Allergy and Immunology

## 2017-08-01 DIAGNOSIS — R945 Abnormal results of liver function studies: Secondary | ICD-10-CM | POA: Diagnosis not present

## 2017-08-01 DIAGNOSIS — R1110 Vomiting, unspecified: Secondary | ICD-10-CM | POA: Diagnosis not present

## 2017-08-01 DIAGNOSIS — R1013 Epigastric pain: Secondary | ICD-10-CM | POA: Diagnosis not present

## 2017-08-01 DIAGNOSIS — R1011 Right upper quadrant pain: Secondary | ICD-10-CM | POA: Diagnosis not present

## 2017-08-01 DIAGNOSIS — R0602 Shortness of breath: Secondary | ICD-10-CM | POA: Diagnosis not present

## 2017-08-01 DIAGNOSIS — N390 Urinary tract infection, site not specified: Secondary | ICD-10-CM | POA: Diagnosis not present

## 2017-08-01 DIAGNOSIS — R109 Unspecified abdominal pain: Secondary | ICD-10-CM | POA: Diagnosis not present

## 2017-08-01 DIAGNOSIS — K5730 Diverticulosis of large intestine without perforation or abscess without bleeding: Secondary | ICD-10-CM | POA: Diagnosis not present

## 2017-08-02 DIAGNOSIS — R7889 Finding of other specified substances, not normally found in blood: Secondary | ICD-10-CM | POA: Diagnosis not present

## 2017-08-02 DIAGNOSIS — Z9049 Acquired absence of other specified parts of digestive tract: Secondary | ICD-10-CM | POA: Diagnosis not present

## 2017-08-02 DIAGNOSIS — K8050 Calculus of bile duct without cholangitis or cholecystitis without obstruction: Secondary | ICD-10-CM | POA: Diagnosis not present

## 2017-08-02 DIAGNOSIS — R7989 Other specified abnormal findings of blood chemistry: Secondary | ICD-10-CM | POA: Diagnosis not present

## 2017-08-02 DIAGNOSIS — Z0389 Encounter for observation for other suspected diseases and conditions ruled out: Secondary | ICD-10-CM | POA: Diagnosis not present

## 2017-08-02 DIAGNOSIS — R109 Unspecified abdominal pain: Secondary | ICD-10-CM | POA: Diagnosis not present

## 2017-08-02 DIAGNOSIS — K828 Other specified diseases of gallbladder: Secondary | ICD-10-CM | POA: Diagnosis not present

## 2017-08-03 DIAGNOSIS — R7889 Finding of other specified substances, not normally found in blood: Secondary | ICD-10-CM | POA: Diagnosis not present

## 2017-08-03 DIAGNOSIS — Z9049 Acquired absence of other specified parts of digestive tract: Secondary | ICD-10-CM | POA: Diagnosis not present

## 2017-08-03 DIAGNOSIS — R109 Unspecified abdominal pain: Secondary | ICD-10-CM | POA: Diagnosis not present

## 2017-08-03 DIAGNOSIS — R7989 Other specified abnormal findings of blood chemistry: Secondary | ICD-10-CM | POA: Diagnosis not present

## 2017-08-03 DIAGNOSIS — K8050 Calculus of bile duct without cholangitis or cholecystitis without obstruction: Secondary | ICD-10-CM | POA: Diagnosis not present

## 2017-08-04 DIAGNOSIS — Z9049 Acquired absence of other specified parts of digestive tract: Secondary | ICD-10-CM | POA: Diagnosis not present

## 2017-08-04 DIAGNOSIS — K828 Other specified diseases of gallbladder: Secondary | ICD-10-CM | POA: Diagnosis not present

## 2017-08-04 DIAGNOSIS — N390 Urinary tract infection, site not specified: Secondary | ICD-10-CM | POA: Diagnosis not present

## 2017-08-04 DIAGNOSIS — K219 Gastro-esophageal reflux disease without esophagitis: Secondary | ICD-10-CM | POA: Diagnosis not present

## 2017-08-04 DIAGNOSIS — R7989 Other specified abnormal findings of blood chemistry: Secondary | ICD-10-CM | POA: Diagnosis not present

## 2017-08-04 DIAGNOSIS — K5790 Diverticulosis of intestine, part unspecified, without perforation or abscess without bleeding: Secondary | ICD-10-CM | POA: Diagnosis not present

## 2017-08-04 DIAGNOSIS — K8051 Calculus of bile duct without cholangitis or cholecystitis with obstruction: Secondary | ICD-10-CM | POA: Diagnosis not present

## 2017-08-04 DIAGNOSIS — Z87891 Personal history of nicotine dependence: Secondary | ICD-10-CM | POA: Diagnosis not present

## 2017-08-04 DIAGNOSIS — E785 Hyperlipidemia, unspecified: Secondary | ICD-10-CM | POA: Diagnosis not present

## 2017-08-04 DIAGNOSIS — K5710 Diverticulosis of small intestine without perforation or abscess without bleeding: Secondary | ICD-10-CM | POA: Diagnosis not present

## 2017-08-04 DIAGNOSIS — K8020 Calculus of gallbladder without cholecystitis without obstruction: Secondary | ICD-10-CM | POA: Diagnosis not present

## 2017-08-04 DIAGNOSIS — K8050 Calculus of bile duct without cholangitis or cholecystitis without obstruction: Secondary | ICD-10-CM | POA: Diagnosis not present

## 2017-08-04 DIAGNOSIS — R109 Unspecified abdominal pain: Secondary | ICD-10-CM | POA: Diagnosis not present

## 2017-08-04 DIAGNOSIS — Z7952 Long term (current) use of systemic steroids: Secondary | ICD-10-CM | POA: Diagnosis not present

## 2017-08-04 DIAGNOSIS — Z79899 Other long term (current) drug therapy: Secondary | ICD-10-CM | POA: Diagnosis not present

## 2017-08-04 DIAGNOSIS — J449 Chronic obstructive pulmonary disease, unspecified: Secondary | ICD-10-CM | POA: Diagnosis not present

## 2017-08-04 DIAGNOSIS — R7889 Finding of other specified substances, not normally found in blood: Secondary | ICD-10-CM | POA: Diagnosis not present

## 2017-08-10 ENCOUNTER — Other Ambulatory Visit: Payer: Self-pay | Admitting: *Deleted

## 2017-08-10 NOTE — Patient Outreach (Signed)
[facility]) Care Management  08/10/2017  [place] Wallis Vancott [birth year] [id number]  Transition of Care Referral  Referral Date: 08/10/17 Referral Source: [employer] Date of Discharge: 08/09/17 Facility: [facility] Discharge Diagnosis: Gallstones Insurance: [employer]  Outreach attempt # 1 to patient. HIPAA verified with patient. Patient has past medical history of a lung nodule, OSA, Polysubstance Abuse, COPD, Asthma, Depression, and HLD. Patient was admitted/discharged from [facility] ([facility]) related to gallstones. Per patient, [facility] removed the gallstones prior to discharge. He was re-admitted to [facility] within one week diagnosed with gallstones. Patient had a cholecystectomy, liver biopsy, and a hernia repair during his inpatient stay at [facility]). Patient was discharged from [facility] on 08/09/17 and he continues to have pain, limited ambulation, and breathing difficulties. He is independent with his ADL's, which requires a significant amount of time. Patient stated, he was very active with yoga and line dancing prior to his illness. He is hoping to get back to his baseline health, soon.  Patient verbalized having a history of polysubstance abuse. He stated, he was prescribed pain medications to take at home for his pain. He stated, he doesn't have to worry about becoming addicted to the pain medication. Per patient, the amount of pain medication wasn't enough to cause addiction. Patient has a history of COPD and Asthma. Patient stated, he was donated a nebulizer machine. Per patient, he is able to get the nebulizer medications from various resources. He doesn't have a prescription for the nebulizer medication. He discussed how he spoke with his Pulmonologist about prescribing nebulizer medication. "He is still waiting on a response from the MD". Patient reported, he does forget to take his medications as prescribed. He spoke about  attempting to stay on task with taking his medication for depression. Patient has a scheduled follow-up hospital discharge appointment on 08/16/17. [facility] services and benefits explained to patient and he agreed to services.    Plan: RN CM will send referral to [facility] RN for further in home eval/assessment of care needs and management of chronic conditions. RN CM will send referral to [facility] for transition of care. RN CM advised patient to contact RNCM for any needs or concerns. RN CM advised patient to alert MD for any changes in conditions.   Lake Bells, RN, BSN, MHA/MSL, [place] Telephonic Care Manager Coordinator [facility] Direct Phone: [phone] Toll Free: [phone] Fax: [phone]

## 2017-08-14 ENCOUNTER — Other Ambulatory Visit: Payer: Self-pay

## 2017-08-14 NOTE — Patient Outreach (Signed)
Transition of care: New referral for transition of care: Week 1 done prior to this referral.  Placed call to patient to explain reason for call.  Patient reports that he is getting better every day. Reports pain of 2/10.  Reports eating and drinking well.  States he is having problems with his COPD since hospital discharge on 08/09/2017  PLAN: offered patient home visit to assess needs and he has agreed to tomorrow at 11am.  Confirmed address and provided my contact information.  Tomasa Rand, RN, BSN, CEN [facility] [employer] [phone]

## 2017-08-15 ENCOUNTER — Other Ambulatory Visit: Payer: Self-pay

## 2017-08-15 NOTE — Patient Outreach (Signed)
[facility]) Care Management   08/15/2017  Nicholas Clay. [birth year] [id number]  Nicholas Clay. is an 66 y.o. male 2 arrived for home visit Subjective:  Arrive for home visit and patient reports that his doctor is Nicholas Lora FNP with [facility]. Denies ever seeing Dr. Henrene Pastor.  Nicholas Clay is not a [facility] provider.  Objective:   ROS  Physical Exam  Encounter Medications:   Outpatient Encounter Medications as of 08/15/2017  Medication Sig Note  . albuterol-ipratropium (COMBIVENT) 18-103 MCG/ACT inhaler Inhale into the lungs every 4 (four) hours.   Marland Kitchen amoxicillin (AMOXIL) 875 MG tablet Take 875 mg by mouth 2 (two) times daily.   . Cetirizine HCl 10 MG CAPS Take by mouth.   . finasteride (PROSCAR) 5 MG tablet Take 5 mg by mouth daily.   Marland Kitchen FLUoxetine (PROZAC) 20 MG capsule Take 20 mg by mouth daily.   . fluticasone (FLONASE) 50 MCG/ACT nasal spray  04/27/2016: Received from: [facility]  . Fluticasone-Salmeterol (ADVAIR DISKUS) 250-50 MCG/DOSE AEPB Inhale 1 puff into the lungs 2 (two) times daily.   Marland Kitchen ipratropium-albuterol (DUONEB) 0.5-2.5 (3) MG/3ML SOLN Take 3 mLs by nebulization every 4 (four) hours as needed.   Marland Kitchen omeprazole (PRILOSEC) 40 MG capsule TAKE 1 CAPSULE BY MOUTH DAILY   . ranitidine (ZANTAC) 300 MG tablet TAKE 1 TABLET BY MOUTH EVERY EVENING   . tamsulosin (FLOMAX) 0.4 MG CAPS capsule Take 0.4 mg by mouth.    No facility-administered encounter medications on file as of 08/15/2017.     Functional Status:   No flowsheet data found.  Fall/Depression Screening:    Fall Risk  08/10/2017  Falls in the past year? No   PHQ 2/9 Scores 08/10/2017  PHQ - 2 Score 0    Assessment:   Patient without a [facility] provider. Placed call to Bary Castilla to confirmed.   Plan:  Discussed with patient that he is ineligible for [facility] services due to lack of [facility] physician. Patient voiced understanding. I encouraged patient to follow up with his  provider and to call [facility] with any concerns about future services.   Will close case at this time. Tomasa Rand, RN, BSN, CEN [facility] [employer] [phone]

## 2017-08-16 DIAGNOSIS — Z5189 Encounter for other specified aftercare: Secondary | ICD-10-CM | POA: Diagnosis not present

## 2017-08-16 DIAGNOSIS — K8020 Calculus of gallbladder without cholecystitis without obstruction: Secondary | ICD-10-CM | POA: Diagnosis not present

## 2017-08-16 DIAGNOSIS — J449 Chronic obstructive pulmonary disease, unspecified: Secondary | ICD-10-CM | POA: Diagnosis not present

## 2017-08-16 DIAGNOSIS — K810 Acute cholecystitis: Secondary | ICD-10-CM | POA: Diagnosis not present

## 2017-08-17 DIAGNOSIS — K5730 Diverticulosis of large intestine without perforation or abscess without bleeding: Secondary | ICD-10-CM | POA: Diagnosis not present

## 2017-08-17 DIAGNOSIS — K810 Acute cholecystitis: Secondary | ICD-10-CM | POA: Diagnosis not present

## 2017-08-17 DIAGNOSIS — K651 Peritoneal abscess: Secondary | ICD-10-CM | POA: Diagnosis not present

## 2017-09-01 DIAGNOSIS — Z01812 Encounter for preprocedural laboratory examination: Secondary | ICD-10-CM | POA: Diagnosis not present

## 2017-09-01 DIAGNOSIS — R188 Other ascites: Secondary | ICD-10-CM | POA: Diagnosis not present

## 2017-09-01 DIAGNOSIS — K828 Other specified diseases of gallbladder: Secondary | ICD-10-CM | POA: Diagnosis not present

## 2017-09-18 DIAGNOSIS — S93402A Sprain of unspecified ligament of left ankle, initial encounter: Secondary | ICD-10-CM | POA: Diagnosis not present

## 2017-09-18 DIAGNOSIS — F329 Major depressive disorder, single episode, unspecified: Secondary | ICD-10-CM | POA: Diagnosis not present

## 2017-10-04 ENCOUNTER — Ambulatory Visit: Payer: Medicare HMO | Admitting: Allergy and Immunology

## 2017-10-19 DIAGNOSIS — J441 Chronic obstructive pulmonary disease with (acute) exacerbation: Secondary | ICD-10-CM | POA: Diagnosis not present

## 2017-11-09 DIAGNOSIS — N5089 Other specified disorders of the male genital organs: Secondary | ICD-10-CM | POA: Diagnosis not present

## 2017-11-10 DIAGNOSIS — N5089 Other specified disorders of the male genital organs: Secondary | ICD-10-CM | POA: Diagnosis not present

## 2017-11-10 DIAGNOSIS — N50812 Left testicular pain: Secondary | ICD-10-CM | POA: Diagnosis not present

## 2017-11-10 DIAGNOSIS — S3994XA Unspecified injury of external genitals, initial encounter: Secondary | ICD-10-CM | POA: Diagnosis not present

## 2017-11-28 DIAGNOSIS — E785 Hyperlipidemia, unspecified: Secondary | ICD-10-CM | POA: Diagnosis not present

## 2017-11-28 DIAGNOSIS — R972 Elevated prostate specific antigen [PSA]: Secondary | ICD-10-CM | POA: Diagnosis not present

## 2018-01-08 DIAGNOSIS — G4733 Obstructive sleep apnea (adult) (pediatric): Secondary | ICD-10-CM | POA: Diagnosis not present

## 2018-01-08 DIAGNOSIS — R5383 Other fatigue: Secondary | ICD-10-CM | POA: Diagnosis not present

## 2018-01-15 DIAGNOSIS — J441 Chronic obstructive pulmonary disease with (acute) exacerbation: Secondary | ICD-10-CM | POA: Diagnosis not present

## 2018-01-24 DIAGNOSIS — F331 Major depressive disorder, recurrent, moderate: Secondary | ICD-10-CM | POA: Diagnosis not present

## 2018-01-31 DIAGNOSIS — G4733 Obstructive sleep apnea (adult) (pediatric): Secondary | ICD-10-CM | POA: Diagnosis not present

## 2018-03-21 ENCOUNTER — Other Ambulatory Visit: Payer: Self-pay | Admitting: Allergy and Immunology

## 2018-03-24 DIAGNOSIS — M25552 Pain in left hip: Secondary | ICD-10-CM | POA: Diagnosis not present

## 2018-03-27 ENCOUNTER — Other Ambulatory Visit: Payer: Self-pay | Admitting: *Deleted

## 2018-03-27 ENCOUNTER — Other Ambulatory Visit: Payer: Self-pay | Admitting: Allergy and Immunology

## 2018-03-27 MED ORDER — RANITIDINE HCL 300 MG PO TABS: 300.0000 mg | ORAL_TABLET | Freq: Every evening | ORAL | 0 refills | Status: DC

## 2018-03-27 MED ORDER — OMEPRAZOLE 40 MG PO CPDR: 40.0000 mg | DELAYED_RELEASE_CAPSULE | Freq: Every day | ORAL | 0 refills | Status: AC

## 2018-03-28 DIAGNOSIS — G4733 Obstructive sleep apnea (adult) (pediatric): Secondary | ICD-10-CM | POA: Diagnosis not present

## 2018-03-31 DIAGNOSIS — J441 Chronic obstructive pulmonary disease with (acute) exacerbation: Secondary | ICD-10-CM | POA: Diagnosis not present

## 2018-03-31 DIAGNOSIS — Z6833 Body mass index (BMI) 33.0-33.9, adult: Secondary | ICD-10-CM | POA: Diagnosis not present

## 2018-04-05 DIAGNOSIS — S4381XA Sprain of other specified parts of right shoulder girdle, initial encounter: Secondary | ICD-10-CM | POA: Diagnosis not present

## 2018-04-05 DIAGNOSIS — Z6833 Body mass index (BMI) 33.0-33.9, adult: Secondary | ICD-10-CM | POA: Diagnosis not present

## 2018-04-18 DIAGNOSIS — F331 Major depressive disorder, recurrent, moderate: Secondary | ICD-10-CM | POA: Diagnosis not present

## 2018-05-01 DIAGNOSIS — Z6834 Body mass index (BMI) 34.0-34.9, adult: Secondary | ICD-10-CM | POA: Diagnosis not present

## 2018-05-01 DIAGNOSIS — J029 Acute pharyngitis, unspecified: Secondary | ICD-10-CM | POA: Diagnosis not present

## 2018-05-19 DIAGNOSIS — R600 Localized edema: Secondary | ICD-10-CM | POA: Diagnosis not present

## 2018-05-24 DIAGNOSIS — G4733 Obstructive sleep apnea (adult) (pediatric): Secondary | ICD-10-CM | POA: Diagnosis not present

## 2018-05-24 DIAGNOSIS — E785 Hyperlipidemia, unspecified: Secondary | ICD-10-CM | POA: Diagnosis not present

## 2018-05-24 DIAGNOSIS — R7301 Impaired fasting glucose: Secondary | ICD-10-CM | POA: Diagnosis not present

## 2018-05-24 DIAGNOSIS — J441 Chronic obstructive pulmonary disease with (acute) exacerbation: Secondary | ICD-10-CM | POA: Diagnosis not present

## 2018-07-12 DIAGNOSIS — F320 Major depressive disorder, single episode, mild: Secondary | ICD-10-CM | POA: Diagnosis not present

## 2018-07-12 DIAGNOSIS — Z6834 Body mass index (BMI) 34.0-34.9, adult: Secondary | ICD-10-CM | POA: Diagnosis not present

## 2018-07-12 DIAGNOSIS — F411 Generalized anxiety disorder: Secondary | ICD-10-CM | POA: Diagnosis not present

## 2018-07-12 DIAGNOSIS — J441 Chronic obstructive pulmonary disease with (acute) exacerbation: Secondary | ICD-10-CM | POA: Diagnosis not present

## 2018-07-27 DIAGNOSIS — J441 Chronic obstructive pulmonary disease with (acute) exacerbation: Secondary | ICD-10-CM | POA: Diagnosis not present

## 2018-07-27 DIAGNOSIS — J019 Acute sinusitis, unspecified: Secondary | ICD-10-CM | POA: Diagnosis not present

## 2018-08-16 DIAGNOSIS — J441 Chronic obstructive pulmonary disease with (acute) exacerbation: Secondary | ICD-10-CM | POA: Diagnosis not present

## 2018-08-16 DIAGNOSIS — Z6835 Body mass index (BMI) 35.0-35.9, adult: Secondary | ICD-10-CM | POA: Diagnosis not present

## 2018-08-30 DIAGNOSIS — J441 Chronic obstructive pulmonary disease with (acute) exacerbation: Secondary | ICD-10-CM | POA: Diagnosis not present

## 2018-08-30 DIAGNOSIS — J019 Acute sinusitis, unspecified: Secondary | ICD-10-CM | POA: Diagnosis not present

## 2018-09-03 ENCOUNTER — Other Ambulatory Visit: Payer: Self-pay | Admitting: Allergy and Immunology

## 2018-09-10 DIAGNOSIS — Z6837 Body mass index (BMI) 37.0-37.9, adult: Secondary | ICD-10-CM | POA: Diagnosis not present

## 2018-09-10 DIAGNOSIS — K219 Gastro-esophageal reflux disease without esophagitis: Secondary | ICD-10-CM | POA: Diagnosis not present

## 2018-10-17 DIAGNOSIS — F331 Major depressive disorder, recurrent, moderate: Secondary | ICD-10-CM | POA: Diagnosis not present

## 2018-10-23 DIAGNOSIS — M545 Low back pain: Secondary | ICD-10-CM | POA: Diagnosis not present

## 2018-10-23 DIAGNOSIS — Z6836 Body mass index (BMI) 36.0-36.9, adult: Secondary | ICD-10-CM | POA: Diagnosis not present

## 2018-10-23 DIAGNOSIS — J441 Chronic obstructive pulmonary disease with (acute) exacerbation: Secondary | ICD-10-CM | POA: Diagnosis not present

## 2018-10-23 DIAGNOSIS — M25552 Pain in left hip: Secondary | ICD-10-CM | POA: Diagnosis not present

## 2018-12-13 DIAGNOSIS — J449 Chronic obstructive pulmonary disease, unspecified: Secondary | ICD-10-CM | POA: Diagnosis not present

## 2018-12-13 DIAGNOSIS — J302 Other seasonal allergic rhinitis: Secondary | ICD-10-CM | POA: Diagnosis not present

## 2019-01-16 DIAGNOSIS — M7551 Bursitis of right shoulder: Secondary | ICD-10-CM | POA: Diagnosis not present

## 2019-02-09 DIAGNOSIS — J441 Chronic obstructive pulmonary disease with (acute) exacerbation: Secondary | ICD-10-CM | POA: Diagnosis not present

## 2019-03-04 DIAGNOSIS — R972 Elevated prostate specific antigen [PSA]: Secondary | ICD-10-CM | POA: Diagnosis not present

## 2019-03-04 DIAGNOSIS — N401 Enlarged prostate with lower urinary tract symptoms: Secondary | ICD-10-CM | POA: Diagnosis not present

## 2019-03-04 DIAGNOSIS — E291 Testicular hypofunction: Secondary | ICD-10-CM | POA: Diagnosis not present

## 2019-03-04 DIAGNOSIS — R351 Nocturia: Secondary | ICD-10-CM | POA: Diagnosis not present

## 2019-03-11 DIAGNOSIS — J449 Chronic obstructive pulmonary disease, unspecified: Secondary | ICD-10-CM | POA: Diagnosis not present

## 2019-04-22 DIAGNOSIS — E785 Hyperlipidemia, unspecified: Secondary | ICD-10-CM | POA: Diagnosis not present

## 2019-04-22 DIAGNOSIS — R7301 Impaired fasting glucose: Secondary | ICD-10-CM | POA: Diagnosis not present

## 2019-04-22 DIAGNOSIS — R5383 Other fatigue: Secondary | ICD-10-CM | POA: Diagnosis not present

## 2019-04-22 DIAGNOSIS — F320 Major depressive disorder, single episode, mild: Secondary | ICD-10-CM | POA: Diagnosis not present

## 2019-04-25 DIAGNOSIS — R5383 Other fatigue: Secondary | ICD-10-CM | POA: Diagnosis not present

## 2019-06-08 DIAGNOSIS — J329 Chronic sinusitis, unspecified: Secondary | ICD-10-CM | POA: Diagnosis not present

## 2019-06-08 DIAGNOSIS — M170 Bilateral primary osteoarthritis of knee: Secondary | ICD-10-CM | POA: Diagnosis not present

## 2019-07-22 DIAGNOSIS — Z6837 Body mass index (BMI) 37.0-37.9, adult: Secondary | ICD-10-CM | POA: Diagnosis not present

## 2019-07-22 DIAGNOSIS — M25552 Pain in left hip: Secondary | ICD-10-CM | POA: Diagnosis not present

## 2019-07-22 DIAGNOSIS — M1712 Unilateral primary osteoarthritis, left knee: Secondary | ICD-10-CM | POA: Diagnosis not present

## 2019-07-22 DIAGNOSIS — Z23 Encounter for immunization: Secondary | ICD-10-CM | POA: Diagnosis not present

## 2019-07-25 DIAGNOSIS — N390 Urinary tract infection, site not specified: Secondary | ICD-10-CM | POA: Diagnosis not present

## 2019-07-25 DIAGNOSIS — R1012 Left upper quadrant pain: Secondary | ICD-10-CM | POA: Diagnosis not present

## 2019-08-12 DIAGNOSIS — G4733 Obstructive sleep apnea (adult) (pediatric): Secondary | ICD-10-CM | POA: Diagnosis not present

## 2019-08-12 DIAGNOSIS — J449 Chronic obstructive pulmonary disease, unspecified: Secondary | ICD-10-CM | POA: Diagnosis not present

## 2021-05-24 ENCOUNTER — Emergency Department (HOSPITAL_COMMUNITY)
Admission: EM | Admit: 2021-05-24 | Discharge: 2021-05-25 | Disposition: A | Discharge status: Home / Self Care | Payer: Medicare HMO | Attending: Emergency Medicine | Admitting: Emergency Medicine

## 2021-05-24 ENCOUNTER — Encounter (HOSPITAL_COMMUNITY): Payer: Self-pay

## 2021-05-24 ENCOUNTER — Other Ambulatory Visit: Payer: Self-pay

## 2021-05-24 ENCOUNTER — Emergency Department (HOSPITAL_COMMUNITY): Payer: Medicare HMO

## 2021-05-24 DIAGNOSIS — J45909 Unspecified asthma, uncomplicated: Secondary | ICD-10-CM | POA: Diagnosis not present

## 2021-05-24 DIAGNOSIS — Z20822 Contact with and (suspected) exposure to covid-19: Secondary | ICD-10-CM | POA: Insufficient documentation

## 2021-05-24 DIAGNOSIS — J441 Chronic obstructive pulmonary disease with (acute) exacerbation: Secondary | ICD-10-CM | POA: Insufficient documentation

## 2021-05-24 DIAGNOSIS — D751 Secondary polycythemia: Secondary | ICD-10-CM | POA: Insufficient documentation

## 2021-05-24 DIAGNOSIS — R739 Hyperglycemia, unspecified: Secondary | ICD-10-CM | POA: Insufficient documentation

## 2021-05-24 DIAGNOSIS — Z7951 Long term (current) use of inhaled steroids: Secondary | ICD-10-CM | POA: Diagnosis not present

## 2021-05-24 DIAGNOSIS — Z87891 Personal history of nicotine dependence: Secondary | ICD-10-CM | POA: Insufficient documentation

## 2021-05-24 DIAGNOSIS — R0602 Shortness of breath: Secondary | ICD-10-CM | POA: Diagnosis present

## 2021-05-24 LAB — BASIC METABOLIC PANEL
Anion gap: 11 (ref 5–15)
BUN: 16 mg/dL (ref 8–23)
CO2: 26 mmol/L (ref 22–32)
Calcium: 9 mg/dL (ref 8.9–10.3)
Chloride: 101 mmol/L (ref 98–111)
Creatinine, Ser: 0.87 mg/dL (ref 0.61–1.24)
GFR, Estimated: >60 mL/min (ref >60)
Glucose, Bld: 220 mg/dL — ABNORMAL HIGH (ref 70–99)
Potassium: 5 mmol/L (ref 3.5–5.1)
Sodium: 138 mmol/L (ref 135–145)

## 2021-05-24 LAB — CBC WITH DIFFERENTIAL/PLATELET
Abs Immature Granulocytes: 0.11 10*3/uL — ABNORMAL HIGH (ref 0.00–0.07)
Basophils Absolute: 0.1 10*3/uL (ref 0.0–0.1)
Basophils Relative: 1 %
Eosinophils Absolute: 0.4 10*3/uL (ref 0.0–0.5)
Eosinophils Relative: 4 %
HCT: 53.9 % — ABNORMAL HIGH (ref 39.0–52.0)
Hemoglobin: 18.3 g/dL — ABNORMAL HIGH (ref 13.0–17.0)
Immature Granulocytes: 1 %
Lymphocytes Relative: 19 %
Lymphs Abs: 2.2 10*3/uL (ref 0.7–4.0)
MCH: 30.9 pg (ref 26.0–34.0)
MCHC: 34 g/dL (ref 30.0–36.0)
MCV: 91 fL (ref 80.0–100.0)
Monocytes Absolute: 1.1 10*3/uL — ABNORMAL HIGH (ref 0.1–1.0)
Monocytes Relative: 9 %
Neutro Abs: 7.7 10*3/uL (ref 1.7–7.7)
Neutrophils Relative %: 66 %
Platelets: 199 10*3/uL (ref 150–400)
RBC: 5.92 MIL/uL — ABNORMAL HIGH (ref 4.22–5.81)
RDW: 13.2 % (ref 11.5–15.5)
WBC: 11.6 10*3/uL — ABNORMAL HIGH (ref 4.0–10.5)
nRBC: 0 % (ref 0.0–0.2)

## 2021-05-24 LAB — BRAIN NATRIURETIC PEPTIDE: B Natriuretic Peptide: 59 pg/mL (ref 0.0–100.0)

## 2021-05-24 NOTE — ED Provider Notes (Signed)
Emergency Medicine Provider Triage Evaluation Note  Nicholas Clay. , a 70 y.o. male  was evaluated in triage.  Pt complains of shortness of breath.  Shortness of breath started over the past couple of days.  His power went out today and he was unable to use his nebulizers and CPAP so it worsened.  He has associated cough with congestion.  Denies fever.  No leg swelling.  He has a history of CHF.  He is on oxygen at home..  Review of Systems  Positive: Cough, shortness of breath, congestion Negative: Fever, leg swelling, chest pain  Physical Exam  BP 113/89 (BP Location: Right Arm)   Pulse 97   Temp 97.7 F (36.5 C) (Oral)   Resp (!) 22   Ht '5\' 11"'$  (1.803 m)   Wt 75.3 kg   SpO2 97% Comment: Simultaneous filing. User may not have seen previous data.  BMI 23.15 kg/m  Gen:   Awake, no distress  Resp:  Normal effort.  Lungs with some rhonchi bilaterally MSK:   Moves extremities without difficulty Other:  Productive cough noted on exam.  Medical Decision Making  Medically screening exam initiated at 9:06 PM.  Appropriate orders placed.  Osinachi S Cure Jr. was informed that the remainder of the evaluation will be completed by another provider, this initial triage assessment does not replace that evaluation, and the importance of remaining in the ED until their evaluation is complete.    Sheila Oats 05/24/21 2108    Dorie Rank, MD 05/26/21 [phone]

## 2021-05-24 NOTE — ED Triage Notes (Signed)
Alert and oriented x 4 normal vs, even respirations, still reports heaviness in chest

## 2021-05-25 LAB — I-STAT ARTERIAL BLOOD GAS, ED
Acid-Base Excess: 0 mmol/L (ref 0.0–2.0)
Bicarbonate: 22.9 mmol/L (ref 20.0–28.0)
Calcium, Ion: 1.16 mmol/L (ref 1.15–1.40)
HCT: 51 % (ref 39.0–52.0)
Hemoglobin: 17.3 g/dL — ABNORMAL HIGH (ref 13.0–17.0)
O2 Saturation: 97 %
Patient temperature: 97.6
Potassium: 3.6 mmol/L (ref 3.5–5.1)
Sodium: 137 mmol/L (ref 135–145)
TCO2: 24 mmol/L (ref 22–32)
pCO2 arterial: 31.4 mmHg — ABNORMAL LOW (ref 32.0–48.0)
pH, Arterial: 7.469 — ABNORMAL HIGH (ref 7.350–7.450)
pO2, Arterial: 83 mmHg (ref 83.0–108.0)

## 2021-05-25 LAB — TROPONIN I (HIGH SENSITIVITY)
Troponin I (High Sensitivity): 18 ng/L — ABNORMAL HIGH (ref <18)
Troponin I (High Sensitivity): 19 ng/L — ABNORMAL HIGH (ref <18)

## 2021-05-25 LAB — RESP PANEL BY RT-PCR (FLU A&B, COVID) ARPGX2
Influenza A by PCR: NEGATIVE
Influenza B by PCR: NEGATIVE
SARS Coronavirus 2 by RT PCR: NEGATIVE

## 2021-05-25 LAB — D-DIMER, QUANTITATIVE: D-Dimer, Quant: 0.49 ug/mL-FEU (ref 0.00–0.50)

## 2021-05-25 MED ORDER — IPRATROPIUM-ALBUTEROL 0.5-2.5 (3) MG/3ML IN SOLN
3.0000 mL | Freq: Once | RESPIRATORY_TRACT | Status: AC
  Administered 2021-05-25: 3 mL via RESPIRATORY_TRACT
  Filled 2021-05-25: qty 3

## 2021-05-25 MED ORDER — PREDNISONE 20 MG PO TABS
40.0000 mg | ORAL_TABLET | Freq: Once | ORAL | Status: AC
  Administered 2021-05-25: 40 mg via ORAL
  Filled 2021-05-25: qty 2

## 2021-05-25 MED ORDER — ALBUTEROL SULFATE (2.5 MG/3ML) 0.083% IN NEBU
5.0000 mg | INHALATION_SOLUTION | Freq: Once | RESPIRATORY_TRACT | Status: AC
  Administered 2021-05-25: 5 mg via RESPIRATORY_TRACT
  Filled 2021-05-25: qty 6

## 2021-05-25 MED ORDER — PREDNISONE 10 MG PO TABS: 20.0000 mg | ORAL_TABLET | Freq: Every day | ORAL | 0 refills | Status: DC

## 2021-05-25 NOTE — ED Notes (Signed)
Patient verbalizes understanding of d/c instructions. Opportunities for questions and answers were provided. Pt d/c from ED via ambulation, family with patient.

## 2021-05-25 NOTE — Discharge Instructions (Addendum)
You are being treated for exacerbation of your chronic obstructive pulmonary disease.  Please use your albuterol every 2-4 hours, take prednisone and doxycycline as prescribed Your hemoglobin is elevated today.  It is elevated from previous and needs to be rechecked by your doctor within the next 1 to 2 weeks. Additionally your blood sugar is elevated and this will need to be followed up as an outpatient with your doctor.

## 2021-05-25 NOTE — ED Provider Notes (Signed)
12:54 AM Patient c/o worsening SOB as well as CP. Reports no CP on arrival, but this has worsened since in the waiting room. Uses nebs at home q 3-4 hours. Not on chronic O2, per patient.   Appears tachypneic and dyspneic, though lungs fairly clear on exam. Did order Duoneb to trend if symptoms improve given hx of COPD. Added EKG, troponin. CXR today without acute cardiopulmonary abnormality.   Antonietta Breach, PA-C 05/25/21 [id number]    Ripley Fraise, MD 05/26/21 [phone]

## 2021-05-25 NOTE — ED Provider Notes (Signed)
[place] EMERGENCY DEPARTMENT Provider Note   CSN: [id number] Arrival date & time: 05/24/21  2047     History Chief Complaint  Patient presents with   Shortness of Breath    Pt has a hx of asthma, was outside and became short of breath, power is out at home and he was unable to take a breathing treatment, all started 3 hours ago. Also feels heavy in chest    Jerrad S Nabil Curless. is a 70 y.o. male.  HPI Shortness of breath 70 year old male history of COPD presents today complaining of shortness of breath and wheezing.  He states that he has had problems with asthma in the past.  He does not specifically state he has COPD, but review of records reveal COPD.  He reports he has had some increased cough productive of some yellowish mucus.  He denies any headache, head injury, fever, chills, nausea, vomiting, diarrhea.  He states he began having some pain in his chest that began after he arrived in the ED.  He describes it as some tightness across his anterior chest.  He reports having similar symptoms in the past.  He has been seen at the hospital and aspirin states that they were never able to help him.  Records from [facility] not able to be reviewed in epic.  I am able to review a note from [facility].  He had EKG, chest x-Deandrae Wajda, breathing treatment, steroids.  He improved and was able to be discharged home at that time.  Also reviewed our multiple notes from primary care office for COPD.     Past Medical History:  Diagnosis Date   Asthma    Emphysema of lung ([place])    Irregular heart beat     There are no problems to display for this patient.   Past Surgical History:  Procedure Laterality Date   ADENOIDECTOMY     EYE SURGERY     HERNIA REPAIR     Left ear deafness  1954   PLASTIC SURGERY     SINOSCOPY     TONSIL SURGERY     TONSILLECTOMY         Family History  Problem Relation Age of  Onset   Heart attack Mother    Heart attack Father    Heart disease Maternal Aunt    Osteoporosis Maternal Aunt    Emphysema Maternal Aunt     Social History   Tobacco Use   Smoking status: Former    Packs/day: 1.00    Years: 43.00    Pack years: 43.00    Types: Cigarettes    Quit date: 06/22/2011    Years since quitting: 9.9   Smokeless tobacco: Never  Substance Use Topics   Alcohol use: No    Alcohol/week: 0.0 standard drinks   Drug use: No    Home Medications Prior to Admission medications   Medication Sig Start Date End Date Taking? Authorizing Provider  albuterol-ipratropium (COMBIVENT) 18-103 MCG/ACT inhaler Inhale into the lungs every 4 (four) hours.    [provider]  amoxicillin (AMOXIL) 875 MG tablet Take 875 mg by mouth 2 (two) times daily.    [provider]  Cetirizine HCl 10 MG CAPS Take by mouth.    [provider]  finasteride (PROSCAR) 5 MG tablet Take 5 mg by mouth daily.    [provider]  FLUoxetine (PROZAC) 20 MG capsule Take  20 mg by mouth daily.    [provider]  fluticasone Asencion Islam) 50 MCG/ACT nasal spray  05/15/14   [provider]  Fluticasone-Salmeterol (ADVAIR DISKUS) 250-50 MCG/DOSE AEPB Inhale 1 puff into the lungs 2 (two) times daily.    [provider]  ipratropium-albuterol (DUONEB) 0.5-2.5 (3) MG/3ML SOLN Take 3 mLs by nebulization every 4 (four) hours as needed.    [provider]  omeprazole (PRILOSEC) 40 MG capsule Take 1 capsule (40 mg total) by mouth daily. 03/27/18   Kozlow, Donnamarie Poag, MD  ranitidine (ZANTAC) 300 MG tablet Take 1 tablet (300 mg total) by mouth every evening. 03/27/18   Kozlow, Donnamarie Poag, MD  tamsulosin (FLOMAX) 0.4 MG CAPS capsule Take 0.4 mg by mouth.    [provider]    Allergies    Patient has no known allergies.  Review of Systems   Review of Systems  All other systems reviewed and are negative.  Physical Exam Updated Vital  Signs BP (!) 132/96   Pulse 88   Temp 97.6 F (36.4 C) (Oral)   Resp 18   Ht 1.803 m ('5\' 11"'$ )   Wt 75.3 kg   SpO2 97%   BMI 23.15 kg/m   Physical Exam Vitals and nursing note reviewed.  Constitutional:      General: He is in acute distress.     Appearance: He is well-developed. He is not ill-appearing.  HENT:     Head: Normocephalic.     Mouth/Throat:     Mouth: Mucous membranes are moist.  Cardiovascular:     Rate and Rhythm: Normal rate and regular rhythm.  Pulmonary:     Effort: Tachypnea present.     Breath sounds: Examination of the right-lower field reveals decreased breath sounds. Examination of the left-lower field reveals decreased breath sounds. Decreased breath sounds present. No wheezing, rhonchi or rales.  Musculoskeletal:     Cervical back: Normal range of motion.     Right lower leg: No tenderness. No edema.     Left lower leg: No tenderness. No edema.  Skin:    General: Skin is warm and dry.     Capillary Refill: Capillary refill takes less than 2 seconds.  Neurological:     General: No focal deficit present.  Psychiatric:        Mood and Affect: Mood is anxious.    ED Results / Procedures / Treatments   Labs (all labs ordered are listed, but only abnormal results are displayed) Labs Reviewed  BASIC METABOLIC PANEL - Abnormal; Notable for the following components:      Result Value   Glucose, Bld 220 (*)    All other components within normal limits  CBC WITH DIFFERENTIAL/PLATELET - Abnormal; Notable for the following components:   WBC 11.6 (*)    RBC 5.92 (*)    Hemoglobin 18.3 (*)    HCT 53.9 (*)    Monocytes Absolute 1.1 (*)    Abs Immature Granulocytes 0.11 (*)    All other components within normal limits  I-STAT ARTERIAL BLOOD GAS, ED - Abnormal; Notable for the following components:   pH, Arterial 7.469 (*)    pCO2 arterial 31.4 (*)    Hemoglobin 17.3 (*)    All other components within normal limits  TROPONIN I (HIGH SENSITIVITY) -  Abnormal; Notable for the following components:   Troponin I (High Sensitivity) 18 (*)    All other components within normal limits  TROPONIN I (HIGH SENSITIVITY) -  Abnormal; Notable for the following components:   Troponin I (High Sensitivity) 19 (*)    All other components within normal limits  RESP PANEL BY RT-PCR (FLU A&B, COVID) ARPGX2  BRAIN NATRIURETIC PEPTIDE  D-DIMER, QUANTITATIVE    EKG EKG Interpretation  Date/Time:  Tuesday May 25 2021 00:51:53 EDT Ventricular Rate:  89 PR Interval:  184 QRS Duration: 94 QT Interval:  378 QTC Calculation: 459 R Axis:   65 Text Interpretation: Sinus rhythm with marked sinus arrhythmia with occasional and consecutive Premature ventricular complexes Nonspecific ST abnormality Abnormal ECG Confirmed by Pattricia Boss [phone]) on 05/25/2021 8:50:47 AM  Radiology DG Chest 2 View  Result Date: 05/24/2021 CLINICAL DATA:  Dyspnea. EXAM: CHEST - 2 VIEW COMPARISON:  May 04, 2021. FINDINGS: The heart size and mediastinal contours are within normal limits. Both lungs are clear. The visualized skeletal structures are unremarkable. IMPRESSION: No active cardiopulmonary disease. Electronically Signed   By: Marijo Conception M.D.   On: 05/24/2021 21:36    Procedures Procedures   Medications Ordered in ED Medications  albuterol (PROVENTIL) (2.5 MG/3ML) 0.083% nebulizer solution 5 mg (has no administration in time range)  predniSONE (DELTASONE) tablet 40 mg (has no administration in time range)  ipratropium-albuterol (DUONEB) 0.5-2.5 (3) MG/3ML nebulizer solution 3 mL (3 mLs Nebulization Given 05/25/21 0059)    ED Course  I have reviewed the triage vital signs and the nursing notes.  Pertinent labs & imaging results that were available during my care of the patient were reviewed by me and considered in my medical decision making (see chart for details).    MDM Rules/Calculators/A&P                           70 year old male presents today  complaining of dyspnea.  Review of records reveal history of COPD.  Chest x-[facility] [place] is clear, CBC is reviewed and significant for hgb 18, mild leukocytosis, hyperglycemia bs 220- no evidence of dka,  mildly elevated troponin but flat 18>19.  D-dimer normal- low index of suspicion for PE, EKG without acute ischemic changes.  Diagnosis most c.w. COPD exacerbation.  Plan continue albuterol, will add prednisone and doxy. Prior troponin elevated at 18 one year ago HGB 14 on last cbc- ? Ongoing hypoxia, doubt volume depletion as patient without decreased po, n/v/d- patient advised will need recheck.  Final Clinical Impression(s) / ED Diagnoses Final diagnoses:  COPD exacerbation ([place])  Polycythemia  Hyperglycemia    Rx / DC Orders ED Discharge Orders     None        Pattricia Boss, MD 05/25/21 1035

## 2021-06-21 DIAGNOSIS — Z9049 Acquired absence of other specified parts of digestive tract: Secondary | ICD-10-CM

## 2021-06-21 DIAGNOSIS — R109 Unspecified abdominal pain: Secondary | ICD-10-CM

## 2021-06-21 DIAGNOSIS — K5792 Diverticulitis of intestine, part unspecified, without perforation or abscess without bleeding: Secondary | ICD-10-CM

## 2021-06-21 DIAGNOSIS — I499 Cardiac arrhythmia, unspecified: Secondary | ICD-10-CM | POA: Insufficient documentation

## 2021-06-21 DIAGNOSIS — R0600 Dyspnea, unspecified: Secondary | ICD-10-CM

## 2021-06-21 DIAGNOSIS — J439 Emphysema, unspecified: Secondary | ICD-10-CM | POA: Insufficient documentation

## 2021-06-21 DIAGNOSIS — R7989 Other specified abnormal findings of blood chemistry: Secondary | ICD-10-CM

## 2021-06-21 DIAGNOSIS — K8050 Calculus of bile duct without cholangitis or cholecystitis without obstruction: Secondary | ICD-10-CM

## 2021-06-21 DIAGNOSIS — F32A Depression, unspecified: Secondary | ICD-10-CM

## 2021-06-21 DIAGNOSIS — R079 Chest pain, unspecified: Secondary | ICD-10-CM

## 2021-06-21 DIAGNOSIS — N390 Urinary tract infection, site not specified: Secondary | ICD-10-CM | POA: Insufficient documentation

## 2021-06-21 HISTORY — DX: Dyspnea, unspecified: R06.00

## 2021-06-21 HISTORY — DX: Unspecified abdominal pain: R10.9

## 2021-06-21 HISTORY — DX: Chest pain, unspecified: R07.9

## 2021-06-21 HISTORY — DX: Diverticulitis of intestine, part unspecified, without perforation or abscess without bleeding: K57.92

## 2021-06-21 HISTORY — DX: Acquired absence of other specified parts of digestive tract: Z90.49

## 2021-06-21 HISTORY — DX: Calculus of bile duct without cholangitis or cholecystitis without obstruction: K80.50

## 2021-06-21 HISTORY — DX: Depression, unspecified: F32.A

## 2021-06-21 HISTORY — DX: Other specified abnormal findings of blood chemistry: R79.89

## 2021-07-14 DIAGNOSIS — J45909 Unspecified asthma, uncomplicated: Secondary | ICD-10-CM | POA: Diagnosis not present

## 2021-07-14 DIAGNOSIS — G8929 Other chronic pain: Secondary | ICD-10-CM | POA: Diagnosis not present

## 2021-07-14 DIAGNOSIS — Z6836 Body mass index (BMI) 36.0-36.9, adult: Secondary | ICD-10-CM | POA: Diagnosis not present

## 2021-07-14 DIAGNOSIS — M25559 Pain in unspecified hip: Secondary | ICD-10-CM | POA: Diagnosis not present

## 2021-07-14 DIAGNOSIS — R0602 Shortness of breath: Secondary | ICD-10-CM | POA: Diagnosis not present

## 2021-07-17 NOTE — Progress Notes (Deleted)
Cardiology Office Note:    Date:  07/17/2021   ID:  Orlene Plum., DOB [birth year], MRN [id number]  PCP:  Charlynn Court, NP  Cardiologist:  Shirlee More, MD   Referring MD: Antionette Fairy, PA-C  ASSESSMENT:    No diagnosis found. PLAN:    In order of problems listed above:  ***  Next appointment   Medication Adjustments/Labs and Tests Ordered: Current medicines are reviewed at length with the patient today.  Concerns regarding medicines are outlined above.  No orders of the defined types were placed in this encounter.  No orders of the defined types were placed in this encounter.    No chief complaint on file. ***  History of Present Illness:    Nicholas S Hobert [place]. is a 70 y.o. male with a history of COPD and obstructive sleep apnea who is being seen today for the evaluation of shortness of breath at the request of Antionette Fairy, PA-C.  Office note 05/19/2021 relates that the patient is having increasing shortness of breath we referred for pulmonary for evaluation.  He was seen [facility] ED 05/24/2021 with increasing shortness of breath.  EKG showed sinus rhythm with occasional and consecutive PVCs and nonspecific ST abnormality his high-sensitivity troponin was top normal with no significant delta on recheck.  proBNP level is low at 59.  Renal function was normal GFR greater than 60 cc creatinine 0.87 potassium 5.0 sodium 138.  CBC showed a hemoglobin of 18.3 hematocrit 53.9.  Chest x-ray showed no active pulmonary disease arterial blood gas showed alkalosis 7.47 PCO2 decreased at 31 PO2 of 83.  There is notation in the ED record that he also has a history of congestive heart failure.  He previously has been seen by Dr. All[facility] [place] for history of COPD that time was not on a diuretic and there is no notation of heart failure in the chart.  He was seen at [place] ED for COPD and shortness of breath 12/22/2019  and there is no notation of heart failure and again not on a loop diuretic.  There is no cardiology evaluation within epic or Care Everywhere and no cardiac diagnostic testing available for review.  He was seen at [facility] ED 05/04/2021 for shortness of breath.  Chest x-ray showed no findings of heart failure not unexpectedly had aortic valve sclerosis and coronary artery calcification.  His diagnosis was dyspnea and asthma he was not given a diuretic but was given a recommendation to follow-up with cardiology for heart failure. His troponin was undetectable and his proBNP level was very low at 79. Past Medical History:  Diagnosis Date   Abdominal pain 06/21/2021   Asthma    Asthma with COPD (chronic obstructive pulmonary disease) ([place]) 04/20/2016   Chest pain 06/21/2021   Choledocholithiasis 06/21/2021   Depression 06/21/2021   Diverticulitis 06/21/2021   Dyspnea 06/21/2021   Elevated liver function tests 06/21/2021   Emphysema of lung ([place])    History of smoking 04/20/2016   Incomplete tear of rotator cuff 12/27/2011   Irregular heart beat    Lung nodule 09/14/2016   OSA (obstructive sleep apnea) 04/20/2016   Status post cholecystectomy 06/21/2021    Past Surgical History:  Procedure Laterality Date   ADENOIDECTOMY     EYE SURGERY     HERNIA REPAIR     Left ear deafness  [place]  TONSIL SURGERY     TONSILLECTOMY      Current Medications: No outpatient medications have been marked as taking for the 07/19/21 encounter (Appointment) with Richardo Priest, MD.     Allergies:   Patient has no known allergies.   Social History   Socioeconomic History   Marital status: Single    Spouse name: Not on file   Number of children: Not on file   Years of education: Not on file   Highest education level: Not on file  Occupational History   Not on file  Tobacco Use   Smoking status: Former    Packs/day: 1.00    Years: 43.00    Pack years: 43.00     Types: Cigarettes    Quit date: 06/22/2011    Years since quitting: 10.0   Smokeless tobacco: Never  Substance and Sexual Activity   Alcohol use: No    Alcohol/week: 0.0 standard drinks   Drug use: No   Sexual activity: Not on file  Other Topics Concern   Not on file  Social History Narrative   Not on file   Social Determinants of Health   Financial Resource Strain: Not on file  Food Insecurity: Not on file  Transportation Needs: Not on file  Physical Activity: Not on file  Stress: Not on file  Social Connections: Not on file     Family History: The patient's ***family history includes Emphysema in his maternal aunt; Heart attack in his father and mother; Heart disease in his maternal aunt; Osteoporosis in his maternal aunt.  ROS:   ROS Please see the history of present illness.    *** All other systems reviewed and are negative.  EKGs/Labs/Other Studies Reviewed:    The following studies were reviewed today: ***  EKG:  EKG is *** ordered today.  The ekg ordered today is personally reviewed and demonstrates ***  Recent Labs: 05/24/2021: B Natriuretic Peptide 59.0; BUN 16; Creatinine, Ser 0.87; Platelets 199 05/25/2021: Hemoglobin 17.3; Potassium 3.6; Sodium 137  Recent Lipid Panel No results found for: CHOL, TRIG, HDL, CHOLHDL, VLDL, LDLCALC, LDLDIRECT  Physical Exam:    VS:  There were no vitals taken for this visit.    Wt Readings from Last 3 Encounters:  05/24/21 166 lb (75.3 kg)  03/30/16 230 lb 12.8 oz (104.7 kg)     GEN: *** Well nourished, well developed in no acute distress HEENT: Normal NECK: No JVD; No carotid bruits LYMPHATICS: No lymphadenopathy CARDIAC: ***RRR, no murmurs, rubs, gallops RESPIRATORY:  Clear to auscultation without rales, wheezing or rhonchi  ABDOMEN: Soft, non-tender, non-distended MUSCULOSKELETAL:  No edema; No deformity  SKIN: Warm and dry NEUROLOGIC:  Alert and oriented x 3 PSYCHIATRIC:  Normal affect      Signed, Shirlee More, MD  07/17/2021 3:39 PM    [facility]

## 2021-07-19 ENCOUNTER — Ambulatory Visit: Payer: Medicare HMO | Admitting: Cardiology

## 2021-07-19 DIAGNOSIS — I2510 Atherosclerotic heart disease of native coronary artery without angina pectoris: Secondary | ICD-10-CM

## 2021-07-19 DIAGNOSIS — G4733 Obstructive sleep apnea (adult) (pediatric): Secondary | ICD-10-CM

## 2021-07-19 DIAGNOSIS — J439 Emphysema, unspecified: Secondary | ICD-10-CM

## 2021-07-19 DIAGNOSIS — J4540 Moderate persistent asthma, uncomplicated: Secondary | ICD-10-CM | POA: Diagnosis not present

## 2021-07-19 DIAGNOSIS — R5383 Other fatigue: Secondary | ICD-10-CM | POA: Diagnosis not present

## 2021-07-19 DIAGNOSIS — G4700 Insomnia, unspecified: Secondary | ICD-10-CM | POA: Diagnosis not present

## 2021-07-19 DIAGNOSIS — F17210 Nicotine dependence, cigarettes, uncomplicated: Secondary | ICD-10-CM | POA: Diagnosis not present

## 2021-07-19 DIAGNOSIS — J310 Chronic rhinitis: Secondary | ICD-10-CM | POA: Diagnosis not present

## 2021-11-16 DIAGNOSIS — I10 Essential (primary) hypertension: Secondary | ICD-10-CM | POA: Diagnosis not present

## 2021-11-16 DIAGNOSIS — J45909 Unspecified asthma, uncomplicated: Secondary | ICD-10-CM | POA: Diagnosis not present

## 2021-11-16 DIAGNOSIS — E7800 Pure hypercholesterolemia, unspecified: Secondary | ICD-10-CM | POA: Diagnosis not present

## 2021-11-16 DIAGNOSIS — R739 Hyperglycemia, unspecified: Secondary | ICD-10-CM | POA: Diagnosis not present

## 2021-11-18 DIAGNOSIS — I10 Essential (primary) hypertension: Secondary | ICD-10-CM | POA: Diagnosis not present

## 2021-11-18 DIAGNOSIS — R739 Hyperglycemia, unspecified: Secondary | ICD-10-CM | POA: Diagnosis not present

## 2021-11-18 DIAGNOSIS — M62830 Muscle spasm of back: Secondary | ICD-10-CM | POA: Diagnosis not present

## 2021-11-18 DIAGNOSIS — E7800 Pure hypercholesterolemia, unspecified: Secondary | ICD-10-CM | POA: Diagnosis not present

## 2021-11-18 DIAGNOSIS — Z6835 Body mass index (BMI) 35.0-35.9, adult: Secondary | ICD-10-CM | POA: Diagnosis not present

## 2021-12-01 DIAGNOSIS — Z713 Dietary counseling and surveillance: Secondary | ICD-10-CM | POA: Diagnosis not present

## 2021-12-01 DIAGNOSIS — E119 Type 2 diabetes mellitus without complications: Secondary | ICD-10-CM | POA: Diagnosis not present

## 2021-12-07 DIAGNOSIS — R202 Paresthesia of skin: Secondary | ICD-10-CM | POA: Diagnosis not present

## 2021-12-07 DIAGNOSIS — Z6834 Body mass index (BMI) 34.0-34.9, adult: Secondary | ICD-10-CM | POA: Diagnosis not present

## 2021-12-07 DIAGNOSIS — E119 Type 2 diabetes mellitus without complications: Secondary | ICD-10-CM | POA: Diagnosis not present

## 2021-12-07 DIAGNOSIS — E7800 Pure hypercholesterolemia, unspecified: Secondary | ICD-10-CM | POA: Diagnosis not present

## 2021-12-22 DIAGNOSIS — J069 Acute upper respiratory infection, unspecified: Secondary | ICD-10-CM | POA: Diagnosis not present

## 2021-12-22 DIAGNOSIS — J45909 Unspecified asthma, uncomplicated: Secondary | ICD-10-CM | POA: Diagnosis not present

## 2021-12-22 DIAGNOSIS — Z6834 Body mass index (BMI) 34.0-34.9, adult: Secondary | ICD-10-CM | POA: Diagnosis not present

## 2022-01-11 DIAGNOSIS — R059 Cough, unspecified: Secondary | ICD-10-CM | POA: Diagnosis not present

## 2022-01-11 DIAGNOSIS — Z6833 Body mass index (BMI) 33.0-33.9, adult: Secondary | ICD-10-CM | POA: Diagnosis not present

## 2022-01-11 DIAGNOSIS — J45909 Unspecified asthma, uncomplicated: Secondary | ICD-10-CM | POA: Diagnosis not present

## 2022-01-13 DIAGNOSIS — J449 Chronic obstructive pulmonary disease, unspecified: Secondary | ICD-10-CM | POA: Diagnosis not present

## 2022-01-13 DIAGNOSIS — J069 Acute upper respiratory infection, unspecified: Secondary | ICD-10-CM | POA: Diagnosis not present

## 2022-01-27 DIAGNOSIS — G4733 Obstructive sleep apnea (adult) (pediatric): Secondary | ICD-10-CM | POA: Diagnosis not present

## 2022-01-27 DIAGNOSIS — E785 Hyperlipidemia, unspecified: Secondary | ICD-10-CM | POA: Diagnosis not present

## 2022-01-27 DIAGNOSIS — F17200 Nicotine dependence, unspecified, uncomplicated: Secondary | ICD-10-CM | POA: Diagnosis not present

## 2022-01-27 DIAGNOSIS — F339 Major depressive disorder, recurrent, unspecified: Secondary | ICD-10-CM | POA: Diagnosis not present

## 2022-01-27 DIAGNOSIS — Z6832 Body mass index (BMI) 32.0-32.9, adult: Secondary | ICD-10-CM | POA: Diagnosis not present

## 2022-01-27 DIAGNOSIS — R7301 Impaired fasting glucose: Secondary | ICD-10-CM | POA: Diagnosis not present

## 2022-01-27 DIAGNOSIS — J4540 Moderate persistent asthma, uncomplicated: Secondary | ICD-10-CM | POA: Diagnosis not present

## 2022-01-27 DIAGNOSIS — G4701 Insomnia due to medical condition: Secondary | ICD-10-CM | POA: Diagnosis not present

## 2022-01-27 DIAGNOSIS — J449 Chronic obstructive pulmonary disease, unspecified: Secondary | ICD-10-CM | POA: Diagnosis not present

## 2022-02-04 DIAGNOSIS — R7301 Impaired fasting glucose: Secondary | ICD-10-CM | POA: Diagnosis not present

## 2022-02-04 DIAGNOSIS — E785 Hyperlipidemia, unspecified: Secondary | ICD-10-CM | POA: Diagnosis not present

## 2022-05-24 DIAGNOSIS — G4733 Obstructive sleep apnea (adult) (pediatric): Secondary | ICD-10-CM | POA: Diagnosis not present

## 2022-05-24 DIAGNOSIS — J069 Acute upper respiratory infection, unspecified: Secondary | ICD-10-CM | POA: Diagnosis not present

## 2022-05-24 DIAGNOSIS — J310 Chronic rhinitis: Secondary | ICD-10-CM | POA: Diagnosis not present

## 2022-05-24 DIAGNOSIS — R5383 Other fatigue: Secondary | ICD-10-CM | POA: Diagnosis not present

## 2022-05-24 DIAGNOSIS — F17210 Nicotine dependence, cigarettes, uncomplicated: Secondary | ICD-10-CM | POA: Diagnosis not present

## 2022-05-24 DIAGNOSIS — J4540 Moderate persistent asthma, uncomplicated: Secondary | ICD-10-CM | POA: Diagnosis not present

## 2022-05-24 DIAGNOSIS — G4700 Insomnia, unspecified: Secondary | ICD-10-CM | POA: Diagnosis not present

## 2022-07-17 IMAGING — CR DG CHEST 2V
2 series · 2 of 2 positions shown · non-contrast
Comparison: May 04, 2021.

CLINICAL DATA: Dyspnea.

EXAM:
CHEST - 2 VIEW

[chest pa]
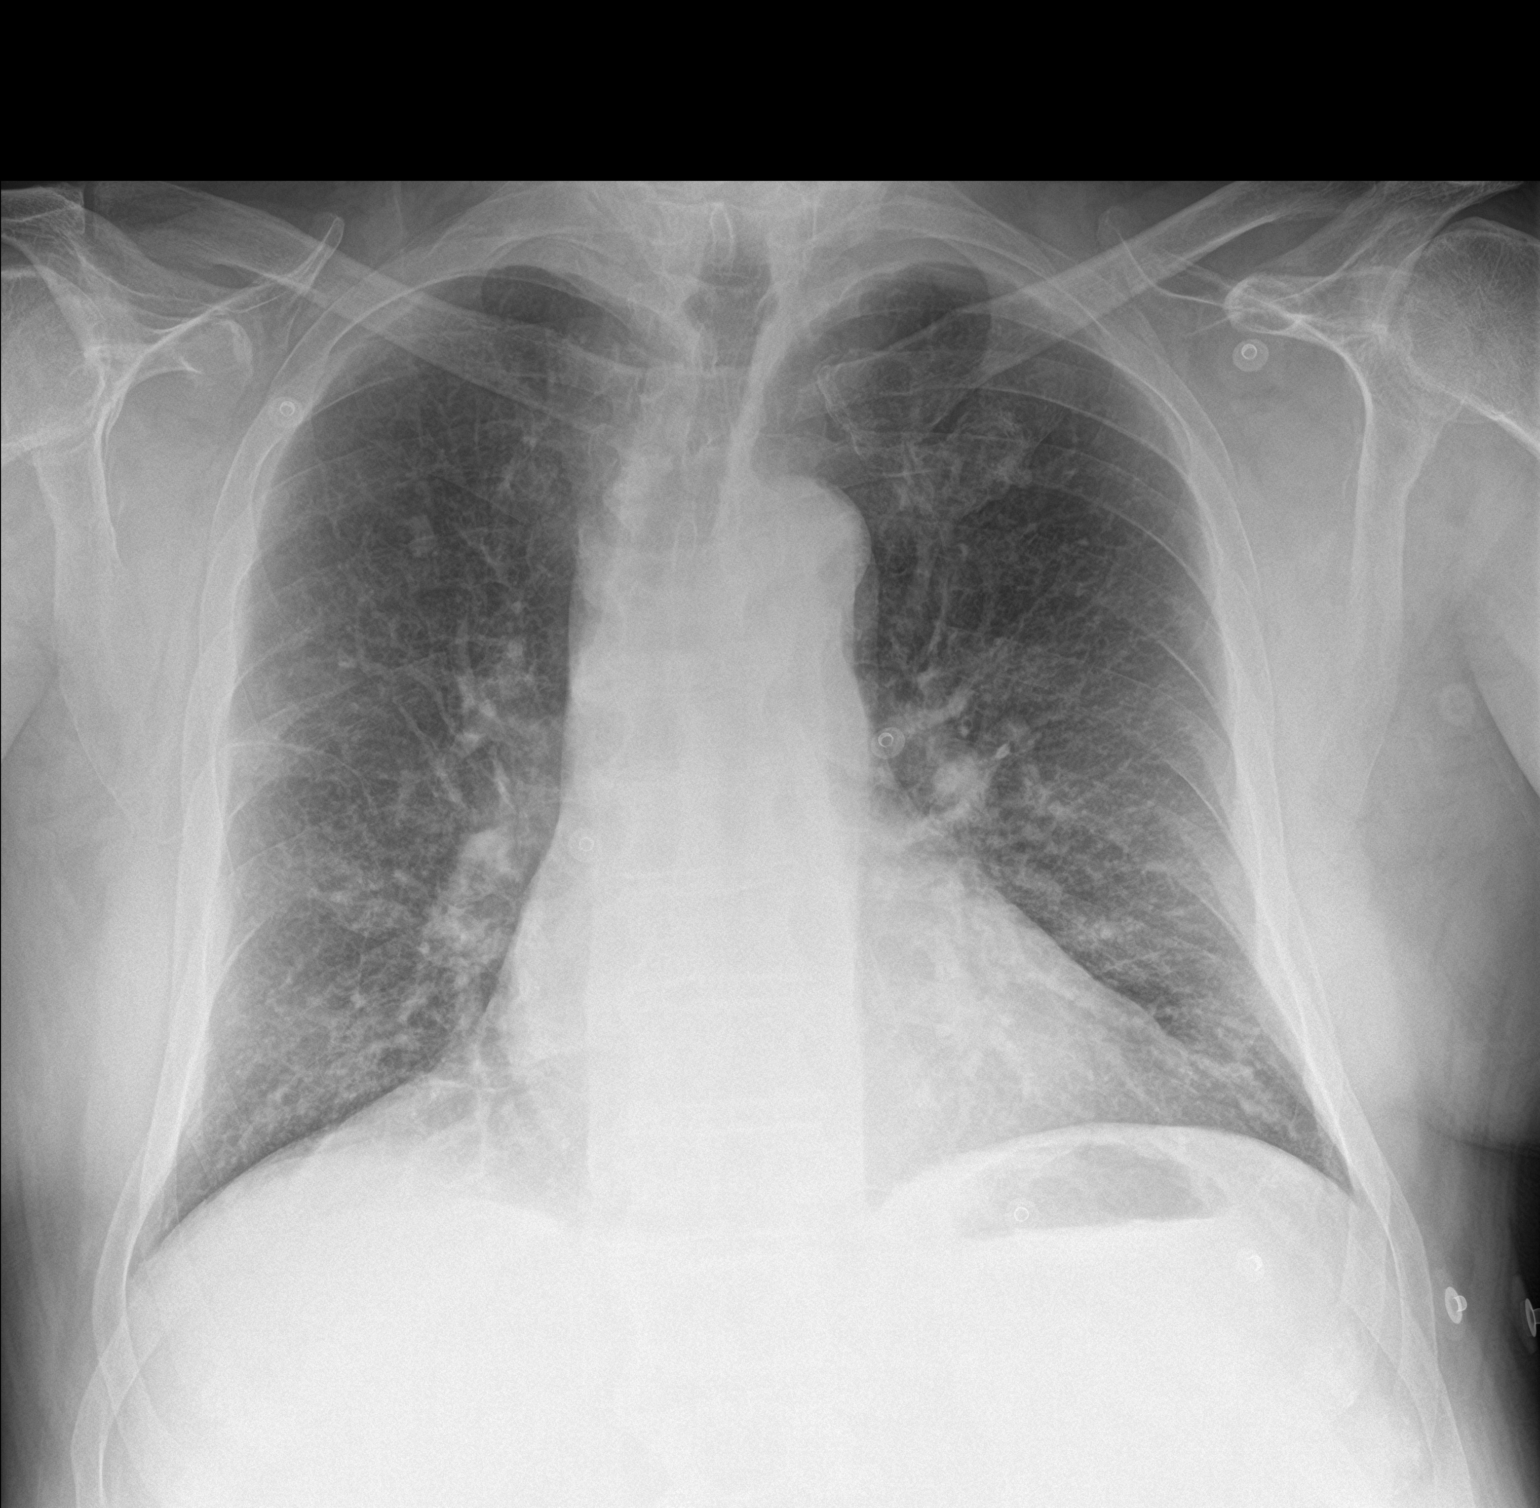

[chest lat]
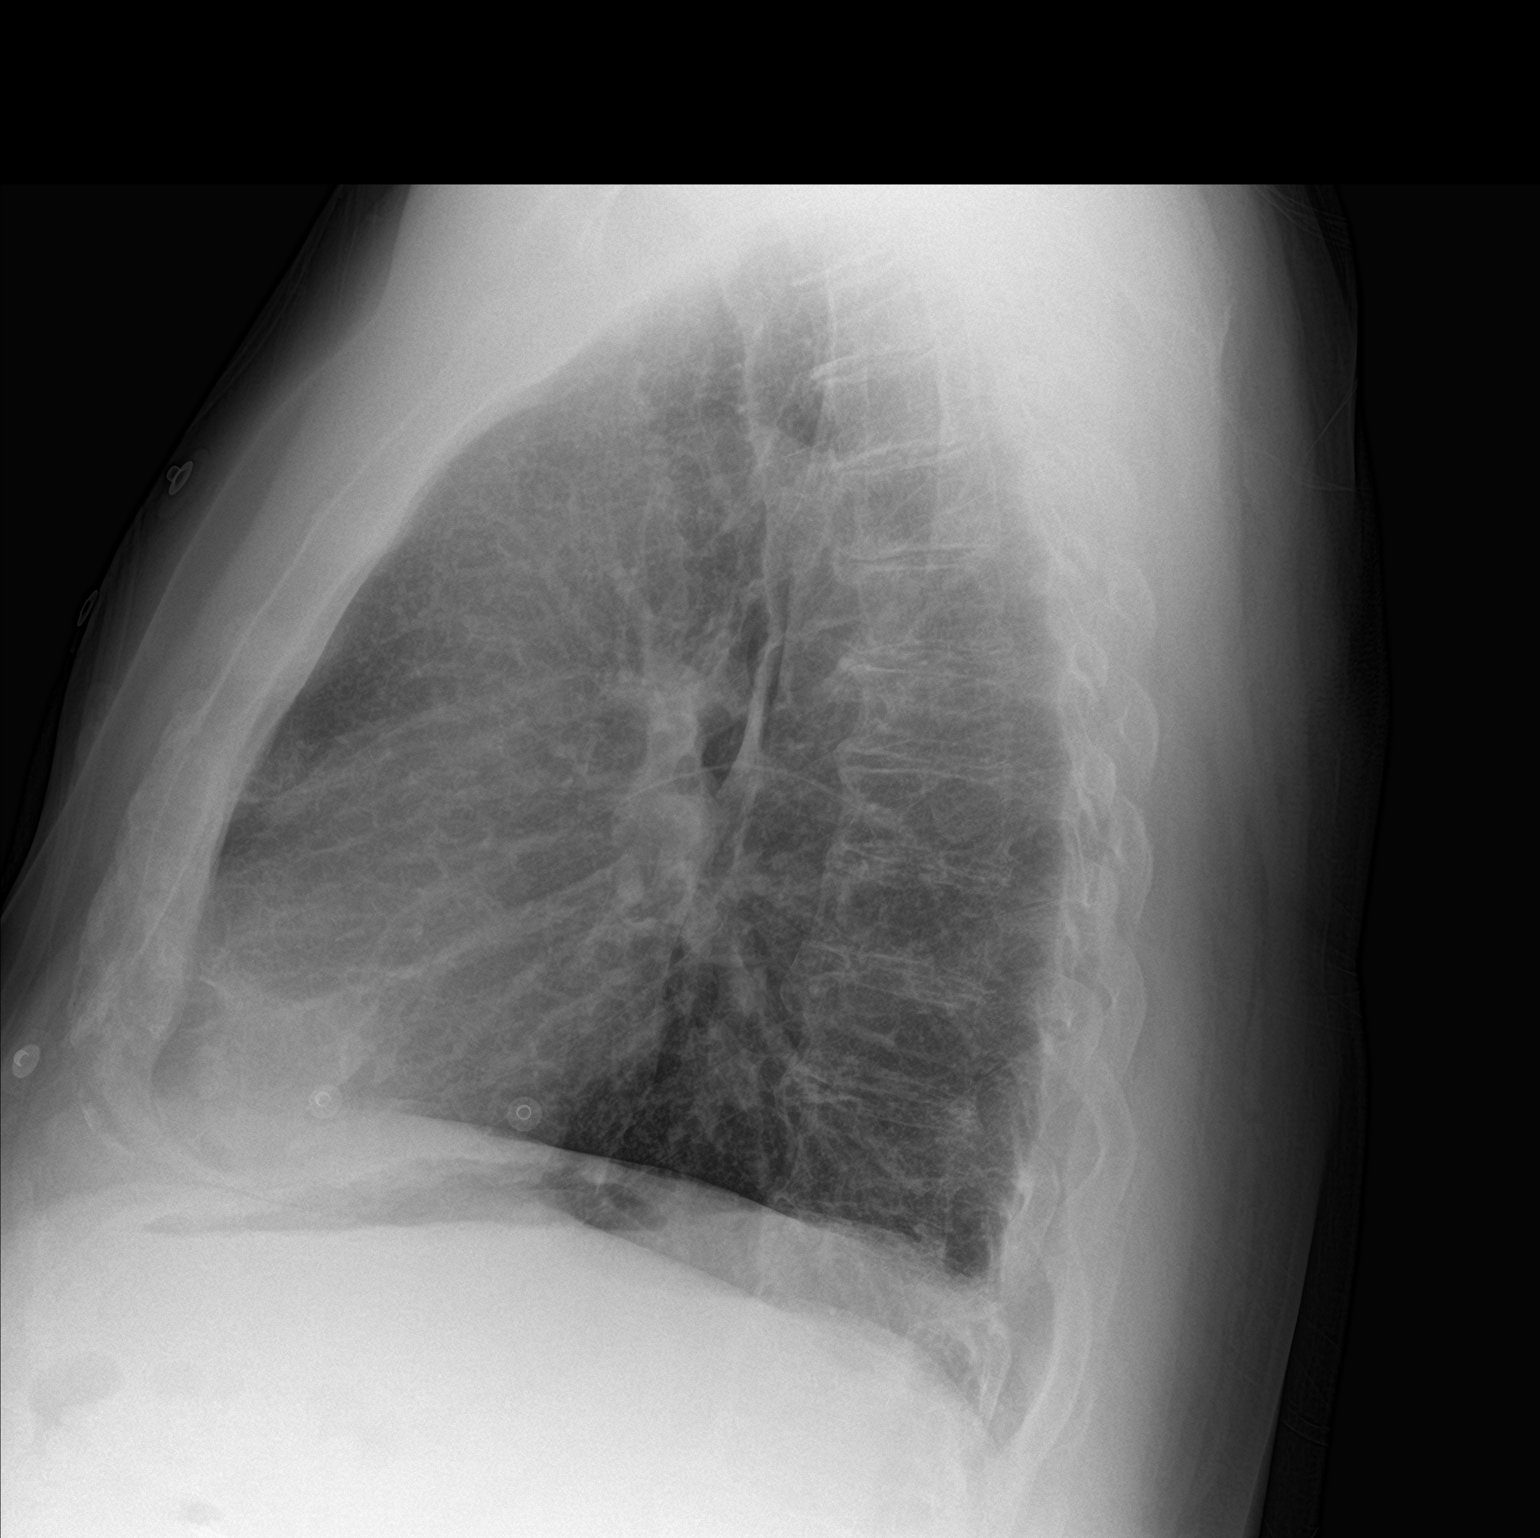

[2 of 2 positions shown; findings below may reference images not displayed]

FINDINGS: The heart size and mediastinal contours are within normal limits.
Both lungs are clear. The visualized skeletal structures are
unremarkable.
IMPRESSION: No active cardiopulmonary disease.

## 2022-11-22 DIAGNOSIS — J441 Chronic obstructive pulmonary disease with (acute) exacerbation: Secondary | ICD-10-CM | POA: Diagnosis not present

## 2022-11-22 DIAGNOSIS — A419 Sepsis, unspecified organism: Secondary | ICD-10-CM | POA: Diagnosis not present

## 2022-11-22 DIAGNOSIS — Z720 Tobacco use: Secondary | ICD-10-CM | POA: Diagnosis not present

## 2022-11-22 DIAGNOSIS — F17210 Nicotine dependence, cigarettes, uncomplicated: Secondary | ICD-10-CM | POA: Diagnosis not present

## 2022-11-22 DIAGNOSIS — N3000 Acute cystitis without hematuria: Secondary | ICD-10-CM | POA: Diagnosis not present

## 2022-11-22 DIAGNOSIS — I493 Ventricular premature depolarization: Secondary | ICD-10-CM | POA: Diagnosis not present

## 2022-11-22 DIAGNOSIS — Z20822 Contact with and (suspected) exposure to covid-19: Secondary | ICD-10-CM | POA: Diagnosis not present

## 2022-11-22 DIAGNOSIS — J449 Chronic obstructive pulmonary disease, unspecified: Secondary | ICD-10-CM | POA: Diagnosis not present

## 2023-06-13 DIAGNOSIS — J309 Allergic rhinitis, unspecified: Secondary | ICD-10-CM | POA: Diagnosis not present

## 2023-06-13 DIAGNOSIS — G2581 Restless legs syndrome: Secondary | ICD-10-CM | POA: Diagnosis not present

## 2023-06-13 DIAGNOSIS — J449 Chronic obstructive pulmonary disease, unspecified: Secondary | ICD-10-CM | POA: Diagnosis not present

## 2023-06-13 DIAGNOSIS — E785 Hyperlipidemia, unspecified: Secondary | ICD-10-CM | POA: Diagnosis not present

## 2023-06-13 DIAGNOSIS — G4733 Obstructive sleep apnea (adult) (pediatric): Secondary | ICD-10-CM | POA: Diagnosis not present

## 2023-06-13 DIAGNOSIS — F5104 Psychophysiologic insomnia: Secondary | ICD-10-CM | POA: Diagnosis not present

## 2023-06-13 DIAGNOSIS — J069 Acute upper respiratory infection, unspecified: Secondary | ICD-10-CM | POA: Diagnosis not present

## 2023-06-13 DIAGNOSIS — K219 Gastro-esophageal reflux disease without esophagitis: Secondary | ICD-10-CM | POA: Diagnosis not present

## 2023-06-13 DIAGNOSIS — F339 Major depressive disorder, recurrent, unspecified: Secondary | ICD-10-CM | POA: Diagnosis not present

## 2023-06-20 DIAGNOSIS — K219 Gastro-esophageal reflux disease without esophagitis: Secondary | ICD-10-CM | POA: Diagnosis not present

## 2023-06-20 DIAGNOSIS — J069 Acute upper respiratory infection, unspecified: Secondary | ICD-10-CM | POA: Diagnosis not present

## 2023-06-20 DIAGNOSIS — J449 Chronic obstructive pulmonary disease, unspecified: Secondary | ICD-10-CM | POA: Diagnosis not present

## 2023-06-20 DIAGNOSIS — G2581 Restless legs syndrome: Secondary | ICD-10-CM | POA: Diagnosis not present

## 2023-06-20 DIAGNOSIS — G4733 Obstructive sleep apnea (adult) (pediatric): Secondary | ICD-10-CM | POA: Diagnosis not present

## 2023-06-20 DIAGNOSIS — J309 Allergic rhinitis, unspecified: Secondary | ICD-10-CM | POA: Diagnosis not present

## 2023-06-20 DIAGNOSIS — F339 Major depressive disorder, recurrent, unspecified: Secondary | ICD-10-CM | POA: Diagnosis not present

## 2023-06-20 DIAGNOSIS — E785 Hyperlipidemia, unspecified: Secondary | ICD-10-CM | POA: Diagnosis not present

## 2023-06-20 DIAGNOSIS — F5104 Psychophysiologic insomnia: Secondary | ICD-10-CM | POA: Diagnosis not present
